# Patient Record
Sex: Male | Born: 1955
Health system: Southern US, Community
[De-identification: ages and names within clinical notes are randomized; demographics above are authoritative.]

## PROBLEM LIST (undated history)

## (undated) ENCOUNTER — Emergency Department (HOSPITAL_BASED_OUTPATIENT_CLINIC_OR_DEPARTMENT_OTHER): Admission: EM | Payer: Federal, State, Local not specified - PPO | Source: Home / Self Care

## (undated) DIAGNOSIS — K709 Alcoholic liver disease, unspecified: Secondary | ICD-10-CM

## (undated) DIAGNOSIS — I10 Essential (primary) hypertension: Secondary | ICD-10-CM

## (undated) HISTORY — DX: Alcoholic liver disease, unspecified: K70.9

---

## 1999-08-06 ENCOUNTER — Emergency Department (HOSPITAL_COMMUNITY): Admission: EM | Admit: 1999-08-06 | Discharge: 1999-08-06 | Payer: Self-pay | Admitting: Emergency Medicine

## 2001-03-22 ENCOUNTER — Emergency Department (HOSPITAL_COMMUNITY): Admission: EM | Admit: 2001-03-22 | Discharge: 2001-03-22 | Payer: Self-pay | Admitting: Emergency Medicine

## 2004-08-17 ENCOUNTER — Emergency Department (HOSPITAL_COMMUNITY): Admission: EM | Admit: 2004-08-17 | Discharge: 2004-08-17 | Payer: Self-pay | Admitting: Emergency Medicine

## 2008-08-21 ENCOUNTER — Emergency Department (HOSPITAL_COMMUNITY): Admission: EM | Admit: 2008-08-21 | Discharge: 2008-08-21 | Payer: Self-pay | Admitting: Emergency Medicine

## 2008-12-03 LAB — HEMOGLOBIN A1C: Hemoglobin A1C: 6.2

## 2011-06-13 LAB — POCT I-STAT, CHEM 8
BUN: 13 mg/dL (ref 6–23)
Calcium, Ion: 1.15 mmol/L (ref 1.12–1.32)
Creatinine, Ser: 1.2 mg/dL (ref 0.4–1.5)
Glucose, Bld: 92 mg/dL (ref 70–99)
TCO2: 25 mmol/L (ref 0–100)

## 2014-08-22 ENCOUNTER — Other Ambulatory Visit: Payer: Self-pay | Admitting: Family Medicine

## 2014-08-22 DIAGNOSIS — R1084 Generalized abdominal pain: Secondary | ICD-10-CM

## 2014-08-22 DIAGNOSIS — R52 Pain, unspecified: Secondary | ICD-10-CM

## 2014-08-23 ENCOUNTER — Other Ambulatory Visit: Payer: Self-pay

## 2015-03-04 ENCOUNTER — Emergency Department (HOSPITAL_COMMUNITY)
Admission: EM | Admit: 2015-03-04 | Discharge: 2015-03-04 | Disposition: A | Discharge status: Home / Self Care | Payer: Federal, State, Local not specified - PPO | Attending: Emergency Medicine | Admitting: Emergency Medicine

## 2015-03-04 ENCOUNTER — Encounter (HOSPITAL_COMMUNITY): Payer: Self-pay | Admitting: *Deleted

## 2015-03-04 DIAGNOSIS — R109 Unspecified abdominal pain: Secondary | ICD-10-CM | POA: Diagnosis not present

## 2015-03-04 DIAGNOSIS — Z79899 Other long term (current) drug therapy: Secondary | ICD-10-CM | POA: Diagnosis not present

## 2015-03-04 DIAGNOSIS — R1084 Generalized abdominal pain: Secondary | ICD-10-CM | POA: Diagnosis present

## 2015-03-04 DIAGNOSIS — I10 Essential (primary) hypertension: Secondary | ICD-10-CM | POA: Insufficient documentation

## 2015-03-04 HISTORY — DX: Essential (primary) hypertension: I10

## 2015-03-04 LAB — COMPREHENSIVE METABOLIC PANEL
ALBUMIN: 3.7 g/dL (ref 3.5–5.0)
ALK PHOS: 50 U/L (ref 38–126)
ALT: 20 U/L (ref 17–63)
ANION GAP: 7 (ref 5–15)
AST: 17 U/L (ref 15–41)
BILIRUBIN TOTAL: 0.4 mg/dL (ref 0.3–1.2)
BUN: 17 mg/dL (ref 6–20)
CO2: 30 mmol/L (ref 22–32)
CREATININE: 0.97 mg/dL (ref 0.61–1.24)
Calcium: 9.1 mg/dL (ref 8.9–10.3)
Chloride: 103 mmol/L (ref 101–111)
Glucose, Bld: 98 mg/dL (ref 65–99)
Potassium: 3.9 mmol/L (ref 3.5–5.1)
Sodium: 140 mmol/L (ref 135–145)
TOTAL PROTEIN: 6.7 g/dL (ref 6.5–8.1)

## 2015-03-04 LAB — URINALYSIS, ROUTINE W REFLEX MICROSCOPIC
Bilirubin Urine: NEGATIVE
Glucose, UA: NEGATIVE mg/dL
HGB URINE DIPSTICK: NEGATIVE
Ketones, ur: NEGATIVE mg/dL
Leukocytes, UA: NEGATIVE
NITRITE: NEGATIVE
PROTEIN: NEGATIVE mg/dL
Specific Gravity, Urine: 1.015 (ref 1.005–1.030)
Urobilinogen, UA: 0.2 mg/dL (ref 0.0–1.0)
pH: 6 (ref 5.0–8.0)

## 2015-03-04 LAB — CBC WITH DIFFERENTIAL/PLATELET
BASOS PCT: 0 % (ref 0–1)
Basophils Absolute: 0 10*3/uL (ref 0.0–0.1)
EOS ABS: 0.1 10*3/uL (ref 0.0–0.7)
EOS PCT: 2 % (ref 0–5)
HCT: 41.2 % (ref 39.0–52.0)
HEMOGLOBIN: 13.7 g/dL (ref 13.0–17.0)
LYMPHS ABS: 1.9 10*3/uL (ref 0.7–4.0)
LYMPHS PCT: 42 % (ref 12–46)
MCH: 26.9 pg (ref 26.0–34.0)
MCHC: 33.3 g/dL (ref 30.0–36.0)
MCV: 80.9 fL (ref 78.0–100.0)
MONO ABS: 0.5 10*3/uL (ref 0.1–1.0)
Monocytes Relative: 11 % (ref 3–12)
Neutro Abs: 2 10*3/uL (ref 1.7–7.7)
Neutrophils Relative %: 45 % (ref 43–77)
PLATELETS: 181 10*3/uL (ref 150–400)
RBC: 5.09 MIL/uL (ref 4.22–5.81)
RDW: 13.6 % (ref 11.5–15.5)
WBC: 4.5 10*3/uL (ref 4.0–10.5)

## 2015-03-04 LAB — LIPASE, BLOOD: LIPASE: 18 U/L — AB (ref 22–51)

## 2015-03-04 MED ORDER — MELOXICAM 7.5 MG PO TABS: 7.5000 mg | ORAL_TABLET | Freq: Every day | ORAL | Status: DC

## 2015-03-04 MED ORDER — SODIUM CHLORIDE 0.9 % IV SOLN
INTRAVENOUS | Status: DC
  Administered 2015-03-04: 13:00:00 via INTRAVENOUS

## 2015-03-04 MED ORDER — KETOROLAC TROMETHAMINE 30 MG/ML IJ SOLN
30.0000 mg | Freq: Once | INTRAMUSCULAR | Status: AC
  Administered 2015-03-04: 30 mg via INTRAVENOUS
  Filled 2015-03-04: qty 1

## 2015-03-04 NOTE — ED Notes (Signed)
Bed: WA18 Expected date:  Expected time:  Means of arrival:  Comments: Triage 2 

## 2015-03-04 NOTE — Discharge Instructions (Signed)
Abdominal Pain Many things can cause abdominal pain. Usually, abdominal pain is not caused by a disease and will improve without treatment. It can often be observed and treated at home. Your health care provider will do a physical exam and possibly order blood tests and X-rays to help determine the seriousness of your pain. However, in many cases, more time must pass before a clear cause of the pain can be found. Before that point, your health care provider may not know if you need more testing or further treatment. HOME CARE INSTRUCTIONS  Monitor your abdominal pain for any changes. The following actions may help to alleviate any discomfort you are experiencing:  Only take over-the-counter or prescription medicines as directed by your health care provider.  Do not take laxatives unless directed to do so by your health care provider.  Try a clear liquid diet (broth, tea, or water) as directed by your health care provider. Slowly move to a bland diet as tolerated. SEEK MEDICAL CARE IF:  You have unexplained abdominal pain.  You have abdominal pain associated with nausea or diarrhea.  You have pain when you urinate or have a bowel movement.  You experience abdominal pain that wakes you in the night.  You have abdominal pain that is worsened or improved by eating food.  You have abdominal pain that is worsened with eating fatty foods.  You have a fever. SEEK IMMEDIATE MEDICAL CARE IF:   Your pain does not go away within 2 hours.  You keep throwing up (vomiting).  Your pain is felt only in portions of the abdomen, such as the right side or the left lower portion of the abdomen.  You pass bloody or black tarry stools. MAKE SURE YOU:  Understand these instructions.   Will watch your condition.   Will get help right away if you are not doing well or get worse.  Document Released: 06/05/2005 Document Revised: 08/31/2013 Document Reviewed: 05/05/2013 [facility] Patient Information  2015 [place], [place]. This information is not intended to replace advice given to you by your health care provider. Make sure you discuss any questions you have with your health care provider.   High-Fiber Diet Fiber is found in fruits, vegetables, and grains. A high-fiber diet encourages the addition of more whole grains, legumes, fruits, and vegetables in your diet. The recommended amount of fiber for adult males is 38 g per day. For adult females, it is 25 g per day. Pregnant and lactating women should get 28 g of fiber per day. If you have a digestive or bowel problem, ask your caregiver for advice before adding high-fiber foods to your diet. Eat a variety of high-fiber foods instead of only a select few type of foods.  PURPOSE  To increase stool bulk.  To make bowel movements more regular to prevent constipation.  To lower cholesterol.  To prevent overeating. WHEN IS THIS DIET USED?  It may be used if you have constipation and hemorrhoids.  It may be used if you have uncomplicated diverticulosis (intestine condition) and irritable bowel syndrome.  It may be used if you need help with weight management.  It may be used if you want to add it to your diet as a protective measure against atherosclerosis, diabetes, and cancer. SOURCES OF FIBER  Whole-grain breads and cereals.  Fruits, such as apples, oranges, bananas, berries, prunes, and pears.  Vegetables, such as green peas, carrots, sweet potatoes, beets, broccoli, cabbage, spinach, and artichokes.  Legumes, such split peas, soy,  lentils.  Almonds. FIBER CONTENT IN FOODS Starches and Grains / Dietary Fiber (g)  Cheerios, 1 cup / 3 g  Corn Flakes cereal, 1 cup / 0.7 g  Rice crispy treat cereal, 1 cup / 0.3 g  Instant oatmeal (cooked),  cup / 2 g  Frosted wheat cereal, 1 cup / 5.1 g  Brown, long-grain rice (cooked), 1 cup / 3.5 g  White, long-grain rice (cooked), 1 cup / 0.6 g  Enriched macaroni (cooked), 1 cup /  2.5 g Legumes / Dietary Fiber (g)  Baked beans (canned, plain, or vegetarian),  cup / 5.2 g  Kidney beans (canned),  cup / 6.8 g  Pinto beans (cooked),  cup / 5.5 g Breads and Crackers / Dietary Fiber (g)  Plain or honey graham crackers, 2 squares / 0.7 g  Saltine crackers, 3 squares / 0.3 g  Plain, salted pretzels, 10 pieces / 1.8 g  Whole-wheat bread, 1 slice / 1.9 g  White bread, 1 slice / 0.7 g  Raisin bread, 1 slice / 1.2 g  Plain bagel, 3 oz / 2 g  Flour tortilla, 1 oz / 0.9 g  Corn tortilla, 1 small / 1.5 g  Hamburger or hotdog bun, 1 small / 0.9 g Fruits / Dietary Fiber (g)  Apple with skin, 1 medium / 4.4 g  Sweetened applesauce,  cup / 1.5 g  Banana,  medium / 1.5 g  Grapes, 10 grapes / 0.4 g  Orange, 1 small / 2.3 g  Raisin, 1.5 oz / 1.6 g  Melon, 1 cup / 1.4 g Vegetables / Dietary Fiber (g)  Green beans (canned),  cup / 1.3 g  Carrots (cooked),  cup / 2.3 g  Broccoli (cooked),  cup / 2.8 g  Peas (cooked),  cup / 4.4 g  Mashed potatoes,  cup / 1.6 g  Lettuce, 1 cup / 0.5 g  Corn (canned),  cup / 1.6 g  Tomato,  cup / 1.1 g Document Released: 08/26/2005 Document Revised: 02/25/2012 Document Reviewed: 11/28/2011 [employer] Patient Information 2015 [place], [place]. This information is not intended to replace advice given to you by your health care provider. Make sure you discuss any questions you have with your health care provider.

## 2015-03-04 NOTE — ED Notes (Addendum)
Abdominal pain and low back pain that started 6 days ago. Saw PCP for pain on Monday. Was scheduled for Colonoscopy on Thursday (unrelated to acute pain) missed d/t pain. Still having pain today, was seen at [facility] prior to ED arrival. Sent for CT of abdomen, r/o stone or diverticulitis. No constipation or diarrhea. No vomiting, fevers.  Alcohol and coffee bring on the pain

## 2015-03-04 NOTE — ED Provider Notes (Signed)
TIME SEEN: 12:30 PM  CHIEF COMPLAINT: Abdominal pain, lower back pain  HPI: Patient is a 59 year old male with history of hypertension who presents to the emergency department with 6 days of diffuse lower abdominal pain that radiates into his lower back. States pain was worse last night after drinking alcohol and coffee. Pain is improved but has not completely come on spontaneously. Is not aware of any alleviating factors. [facility] nurse there who told him to come to the emergency department because he would need a CT scan of his abdomen. He did not see a physician, APP at the Urgent Care.  He denies any fevers, chills, nausea, vomiting, diarrhea, dysuria, hematuria, hematochezia or melena, testicular pain or swelling, penile discharge. Has never had similar pain. Denies history of abdominal surgeries. States he is having normal bowel movements every day.    ROS: See HPI Constitutional: no fever  Eyes: no drainage  ENT: no runny nose   Cardiovascular:  no chest pain  Resp: no SOB  GI: no vomiting GU: no dysuria Integumentary: no rash  Allergy: no hives  Musculoskeletal: no leg swelling  Neurological: no slurred speech ROS otherwise negative  PAST MEDICAL HISTORY/PAST SURGICAL HISTORY:  Past Medical History  Diagnosis Date  . Hypertension     MEDICATIONS:  Prior to Admission medications   Medication Sig Start Date End Date Taking? Authorizing Provider  ibuprofen (ADVIL,MOTRIN) 200 MG tablet Take 400 mg by mouth every 4 (four) hours as needed for fever, headache, mild pain, moderate pain or cramping.   Yes Historical Provider, MD  valsartan-hydrochlorothiazide (DIOVAN-HCT) 320-12.5 MG per tablet Take 1 tablet by mouth daily. 01/05/15  Yes Historical Provider, MD    ALLERGIES:  No Known Allergies  SOCIAL HISTORY:  History  Substance Use Topics  . Smoking status: Never Smoker   . Smokeless tobacco: Not on file  . Alcohol Use: Yes   Comment: beer and liquor, socially    FAMILY HISTORY: No family history on file.  EXAM: BP 132/85 mmHg  Pulse 60  Temp(Src) 97.7 F (36.5 C) (Oral)  Resp 17  SpO2 97% CONSTITUTIONAL: Alert and oriented and responds appropriately to questions. Well-appearing; well-nourished, in no distress, nontoxic, afebrile, well hydrated HEAD: Normocephalic EYES: Conjunctivae clear, PERRL ENT: normal nose; no rhinorrhea; moist mucous membranes; pharynx without lesions noted NECK: Supple, no meningismus, no LAD  CARD: RRR; S1 and S2 appreciated; no murmurs, no clicks, no rubs, no gallops RESP: Normal chest excursion without splinting or tachypnea; breath sounds clear and equal bilaterally; no wheezes, no rhonchi, no rales, no hypoxia or respiratory distress, speaking full sentences ABD/GI: Normal bowel sounds; non-distended; soft, non-tender, no rebound, no guarding, no peritoneal signs, no tenderness at McBurney's point, negative Murphy sign, abdominal exam is completely benign  BACK:  The back appears normal and is non-tender to palpation, there is no CVA tenderness, no midline spinal tenderness or step-off or deformity, no tenderness over the paraspinal musculature EXT: Normal ROM in all joints; non-tender to palpation; no edema; normal capillary refill; no cyanosis, no calf tenderness or swelling    SKIN: Normal color for age and race; warm NEURO: Moves all extremities equally, sensation to light touch intact diffusely, cranial nerves II through XII intact PSYCH: The patient's mood and manner are appropriate. Grooming and personal hygiene are appropriate.  MEDICAL DECISION MAKING: Patient here with lower abdominal pain with no other associated symptoms. He is afebrile and hemodynamically stable.  His abdominal exam is completely benign. Discussed with patient that at this time do not feel a CT scan is indicated as I feel the risk of radiation exposure outweigh any benefits. Will order abdominal labs  and urine. Will give IV fluids and Toradol for pain. Patient is comfortable with this plan.  ED PROGRESS: Patient's labs are completely unremarkable. No leukocytosis. LFTs, creatinine and lipase normal. Urine shows no sign of infection and no hematuria. Pain improved with Toradol. We'll discharge with prescription for meloxicam to take as needed for pain. He has appointment with a gastroenterologist on Monday, 2 days from now to set up a routine colonoscopy. Also, since she follow-up with his primary care physician if pain continues. Discussed return precautions including worsening pain, fevers, vomiting, bloody stools or melena. He verbalizes understanding and is comfortable with this plan.     Layla Maw Cloy Cozzens, DO 03/04/15 [phone]

## 2015-03-06 ENCOUNTER — Other Ambulatory Visit: Payer: Self-pay | Admitting: Gastroenterology

## 2015-03-06 DIAGNOSIS — R1031 Right lower quadrant pain: Secondary | ICD-10-CM

## 2015-03-09 ENCOUNTER — Ambulatory Visit
Admission: RE | Admit: 2015-03-09 | Discharge: 2015-03-09 | Disposition: A | Discharge status: Home / Self Care | Payer: Federal, State, Local not specified - PPO | Source: Ambulatory Visit | Attending: Gastroenterology | Admitting: Gastroenterology

## 2015-03-09 DIAGNOSIS — R1031 Right lower quadrant pain: Secondary | ICD-10-CM

## 2015-03-09 MED ORDER — IOPAMIDOL (ISOVUE-300) INJECTION 61%
125.0000 mL | Freq: Once | INTRAVENOUS | Status: AC | PRN
  Administered 2015-03-09: 125 mL via INTRAVENOUS

## 2015-03-16 ENCOUNTER — Other Ambulatory Visit: Payer: Federal, State, Local not specified - PPO

## 2015-06-14 ENCOUNTER — Ambulatory Visit (INDEPENDENT_AMBULATORY_CARE_PROVIDER_SITE_OTHER): Payer: Federal, State, Local not specified - PPO

## 2015-06-14 ENCOUNTER — Ambulatory Visit: Payer: Federal, State, Local not specified - PPO

## 2015-06-14 ENCOUNTER — Ambulatory Visit (INDEPENDENT_AMBULATORY_CARE_PROVIDER_SITE_OTHER): Payer: Federal, State, Local not specified - PPO | Admitting: Podiatry

## 2015-06-14 DIAGNOSIS — M779 Enthesopathy, unspecified: Secondary | ICD-10-CM | POA: Diagnosis not present

## 2015-06-14 DIAGNOSIS — M21619 Bunion of unspecified foot: Secondary | ICD-10-CM | POA: Diagnosis not present

## 2015-06-14 DIAGNOSIS — S93601A Unspecified sprain of right foot, initial encounter: Secondary | ICD-10-CM

## 2015-06-14 MED ORDER — NAPROXEN 500 MG PO TABS: 500.0000 mg | ORAL_TABLET | Freq: Two times a day (BID) | ORAL | Status: DC

## 2015-06-14 MED ORDER — TRIAMCINOLONE ACETONIDE 10 MG/ML IJ SUSP
10.0000 mg | Freq: Once | INTRAMUSCULAR | Status: AC
  Administered 2015-06-14: 10 mg

## 2015-06-14 NOTE — Progress Notes (Signed)
   Subjective:    Patient ID: Donald Powers, male    DOB: 11/21/1955, 59 y.o.   MRN: [id number]  HPI Pt presents with right foot pain in ankle and heel lasting 3 weeks. Pain is located on the medial side of the right foot, he has tried nsaids and different shoes but pain is still present   Review of Systems  All other systems reviewed and are negative.      Objective:   Physical Exam        Assessment & Plan:

## 2015-06-14 NOTE — Progress Notes (Signed)
Subjective:     Patient ID: BERTIS HUSTEAD, male   DOB: [birth year], 59 y.o.   MRN: [id number]  HPI patient states I've had a lot of pain around my right ankle for the last 3 weeks and it's been quite inflamed and sore. Patient states that he's tried to reduce activity and wear supportive shoe without relief   Review of Systems  All other systems reviewed and are negative.      Objective:   Physical Exam  Constitutional: He is oriented to person, place, and time.  Cardiovascular: Intact distal pulses.   Musculoskeletal: Normal range of motion.  Neurological: He is oriented to person, place, and time.  Skin: Skin is warm.  Nursing note and vitals reviewed.  neurovascular status found to be intact with muscle strength adequate range of motion within normal limits. Patient's noted to have quite a bit of inflammation around the medial ankle posterior tibial tendon as it comes under the medial malleolus with depression of the arch noted. The tendon appears to be functioning but is quite inflamed and there is significant structural malalignment with large bunion deformity and digital deformities right     Assessment:     Collapse medial longitudinal arch with tendinitis and pain along with structural bunion digital deformity    Plan:     H&P x-rays reviewed and discussed condition. Today I carefully injected sheath of the tendon 3 mg Kenalog 5 mg Xylocaine advised on ice therapy supportive shoe gear usage and possibility long-term for orthotics. Dispensed fascial brace with instructions on usage and reappoint in the next 2 weeks

## 2015-06-22 ENCOUNTER — Encounter: Payer: Self-pay | Admitting: Podiatry

## 2015-06-22 ENCOUNTER — Ambulatory Visit (INDEPENDENT_AMBULATORY_CARE_PROVIDER_SITE_OTHER): Payer: Federal, State, Local not specified - PPO | Admitting: Podiatry

## 2015-06-22 DIAGNOSIS — M779 Enthesopathy, unspecified: Secondary | ICD-10-CM | POA: Diagnosis not present

## 2015-06-22 DIAGNOSIS — M722 Plantar fascial fibromatosis: Secondary | ICD-10-CM | POA: Diagnosis not present

## 2015-06-22 DIAGNOSIS — S93601A Unspecified sprain of right foot, initial encounter: Secondary | ICD-10-CM | POA: Diagnosis not present

## 2015-06-23 NOTE — Progress Notes (Signed)
Subjective:     Patient ID: Donald Powers, male   DOB: 08-03-1956, 59 y.o.   MRN: [id number]  HPI patient presents stating I'm still getting pain but I am somewhat improved from previous but unable to walk without discomfort   Review of Systems     Objective:   Physical Exam Neurovascular status intact muscle strength adequate with diminishment of discomfort in the right foot but pain still present upon deep palpation with moderate depression of the arch noted    Assessment:     Inflammatory tendinitis right foot still present but improved    Plan:     Advised on physical therapy and anti-inflammatories supportive shoe gear and scanned for Berkley type orthotic to reduce plantar stresses

## 2015-07-14 ENCOUNTER — Ambulatory Visit: Payer: Federal, State, Local not specified - PPO | Admitting: *Deleted

## 2015-07-14 DIAGNOSIS — M779 Enthesopathy, unspecified: Secondary | ICD-10-CM

## 2015-07-14 NOTE — Progress Notes (Signed)
Patient ID: Donald Powers, male   DOB: 1956/08/01, 59 y.o.   MRN: [id number] Patient presents for orthotic pick up.  Verbal and written break in and wear instructions given.  Patient will follow up in 4 weeks if symptoms worsen or fail to improve.

## 2015-07-14 NOTE — Patient Instructions (Signed)

## 2015-08-07 ENCOUNTER — Ambulatory Visit (INDEPENDENT_AMBULATORY_CARE_PROVIDER_SITE_OTHER): Payer: Federal, State, Local not specified - PPO | Admitting: Podiatry

## 2015-08-07 ENCOUNTER — Encounter: Payer: Self-pay | Admitting: Podiatry

## 2015-08-07 VITALS — BP 128/80 | HR 62 | Resp 16

## 2015-08-07 DIAGNOSIS — M779 Enthesopathy, unspecified: Secondary | ICD-10-CM | POA: Diagnosis not present

## 2015-08-07 MED ORDER — TRIAMCINOLONE ACETONIDE 10 MG/ML IJ SUSP
10.0000 mg | Freq: Once | INTRAMUSCULAR | Status: AC
  Administered 2015-08-07: 10 mg

## 2015-08-08 NOTE — Progress Notes (Signed)
Subjective:     Patient ID: Donald Powers, male   DOB: May 04, 1956, 59 y.o.   MRN: [id number]  HPI patient states she's been getting some irritation along the inside of the right ankle and was not sure if the orthotic might be giving him problems   Review of Systems     Objective:   Physical Exam Neurovascular status intact with some sheath discomfort at the right posterior tibial as it comes under the medial malleolus with no indication of muscle strength loss    Assessment:     Inflammatory tendinitis right    Plan:     I advised this patient on physical therapy supportive orthotic usage and I reinjected the sheath 3 mg Kenalog 5 mg Xylocaine after discussing risk. May require other treatments if symptoms continue to persist

## 2015-08-16 ENCOUNTER — Ambulatory Visit: Payer: Federal, State, Local not specified - PPO | Admitting: Podiatry

## 2015-11-15 ENCOUNTER — Other Ambulatory Visit: Payer: Federal, State, Local not specified - PPO

## 2015-11-16 ENCOUNTER — Ambulatory Visit: Payer: Federal, State, Local not specified - PPO | Admitting: *Deleted

## 2015-11-16 DIAGNOSIS — M779 Enthesopathy, unspecified: Secondary | ICD-10-CM

## 2015-11-16 NOTE — Patient Instructions (Signed)

## 2015-11-16 NOTE — Progress Notes (Signed)
Patient ID: Donald Powers, male   DOB: 1956/02/09, 60 y.o.   MRN: [id number] Patient presents for adjusted orthotic pick up.  Verbal and written break in and wear instructions given.  Patient will follow up in 4 weeks if symptoms worsen or fail to improve.

## 2015-12-07 ENCOUNTER — Ambulatory Visit: Payer: Federal, State, Local not specified - PPO | Admitting: Podiatry

## 2015-12-13 ENCOUNTER — Ambulatory Visit: Payer: Federal, State, Local not specified - PPO | Admitting: Podiatry

## 2015-12-14 ENCOUNTER — Encounter: Payer: Self-pay | Admitting: Podiatry

## 2015-12-14 ENCOUNTER — Ambulatory Visit (INDEPENDENT_AMBULATORY_CARE_PROVIDER_SITE_OTHER): Payer: Federal, State, Local not specified - PPO | Admitting: Podiatry

## 2015-12-14 VITALS — BP 145/94 | HR 67 | Resp 14

## 2015-12-14 DIAGNOSIS — M779 Enthesopathy, unspecified: Secondary | ICD-10-CM | POA: Diagnosis not present

## 2015-12-14 DIAGNOSIS — M21619 Bunion of unspecified foot: Secondary | ICD-10-CM | POA: Diagnosis not present

## 2015-12-14 DIAGNOSIS — S93601A Unspecified sprain of right foot, initial encounter: Secondary | ICD-10-CM

## 2015-12-14 NOTE — Progress Notes (Signed)
Subjective:     Patient ID: Donald Powers, male   DOB: 1955-11-27, 60 y.o.   MRN: [id number]  HPI this patient presents the office with the return of pain on the inside of his right foot. Patient states that he was diagnosed as having a tendinitis, right foot by Dr. Charlsie Merlesregal. He was treated with injection therapy as well as a plantar fascial brace. He states he has done well for months, but that the pain has returned. He states that he walks on concrete floors which aggravates his condition. He presents the office today for further evaluation and treatment of this condition   Review of Systems     Objective:   Physical Exam GENERAL APPEARANCE: Alert, conversant. Appropriately groomed. No acute distress.  VASCULAR: Pedal pulses are  palpable at  [facility] bilateral.  Capillary refill time is immediate to all digits,  Normal temperature gradient.  Digital hair growth is present bilateral  NEUROLOGIC: sensation is normal to 5.07 monofilament at 5/5 sites bilateral.  Light touch is intact bilateral, Muscle strength normal.  MUSCULOSKELETAL: acceptable muscle strength, tone and stability bilateral.  Intrinsic muscluature intact bilateral.  Rectus appearance of foot and digits noted bilateral. Severe HAV deformity 1st MPJ right foot. Bony prominence on the TNJ right foot. Patient stands with flexible pes planus.  Muscle power WNL.    DERMATOLOGIC: skin color, texture, and turgor are within normal limits.  No preulcerative lesions or ulcers  are seen, no interdigital maceration noted.  No open lesions present.  Digital nails are asymptomatic. No drainage noted.      Assessment:  PTT tendinitis  Rearfoot arthritis right foot.     Plan:     ROV.  Evaluated right foot and he has arthritic changes and insertional tendinitis pain right foot.  Treated with injection therapy.  Dispense plantar fascial brace.  To consider [place] brace.  RTC prn   Helane GuntherGregory Brecken Dewoody DPM

## 2016-09-04 DIAGNOSIS — R52 Pain, unspecified: Secondary | ICD-10-CM | POA: Diagnosis not present

## 2016-09-04 DIAGNOSIS — J111 Influenza due to unidentified influenza virus with other respiratory manifestations: Secondary | ICD-10-CM | POA: Diagnosis not present

## 2016-11-26 DIAGNOSIS — I10 Essential (primary) hypertension: Secondary | ICD-10-CM | POA: Diagnosis not present

## 2017-03-22 IMAGING — CT CT ABD-PELV W/ CM
3 of 5 series · 13 of 36 positions shown, 19 images · IV contrast (READICAT/WATER & [ID] ISOVUE 300)
Comparison: None.

CLINICAL DATA: Right lower quadrant and flank pain, episodic left
lower quadrant abdominal pain.

EXAM:
CT ABDOMEN AND PELVIS WITH CONTRAST
TECHNIQUE: Multidetector CT imaging of the abdomen and pelvis was performed
using the standard protocol following bolus administration of
intravenous contrast.
CONTRAST:  125mL BQ0RG1-2FF IOPAMIDOL (BQ0RG1-2FF) INJECTION 61%

[Series 3: abd/pelvis with · axial · 0.80mm/px · z∈[-366,-21]mm · 8 of 89 slices shown, 13 images]
[im 10/89  soft-tissue]
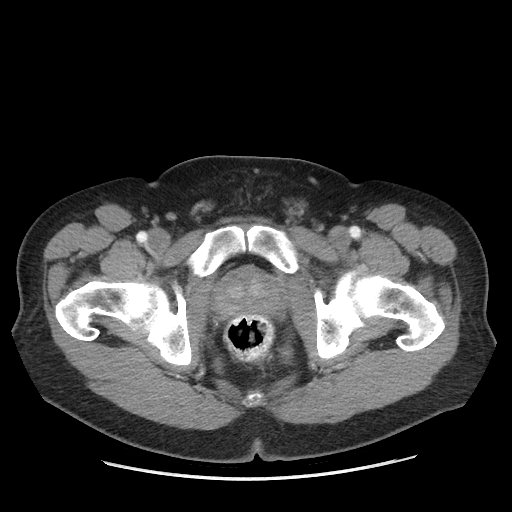
[im 10/89  bone]
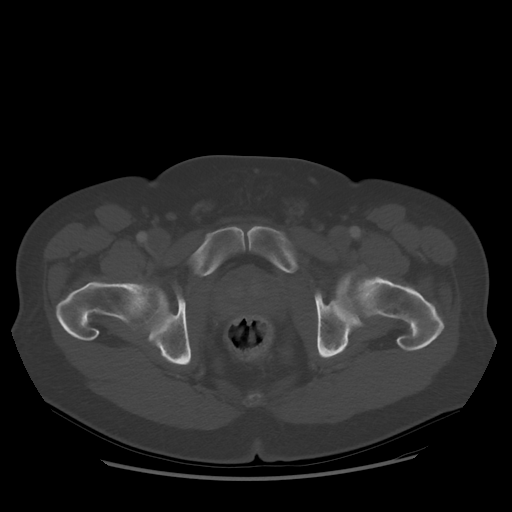
[im 20/89  soft-tissue]
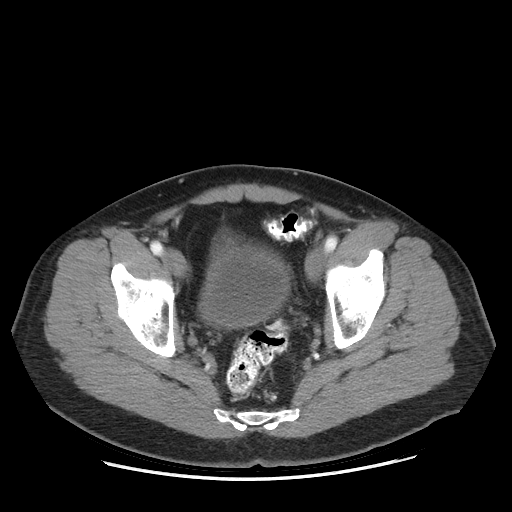
[im 30/89  soft-tissue]
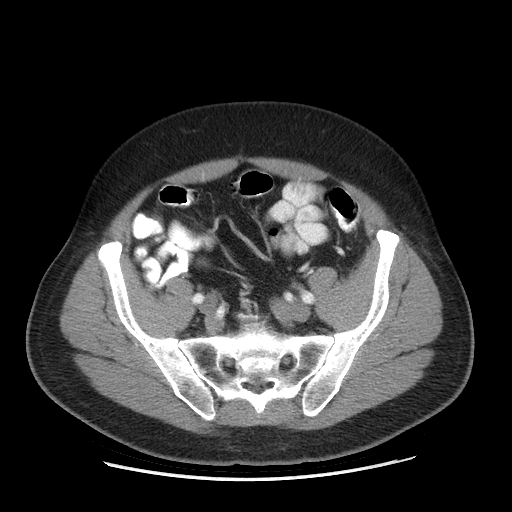
[im 40/89  soft-tissue]
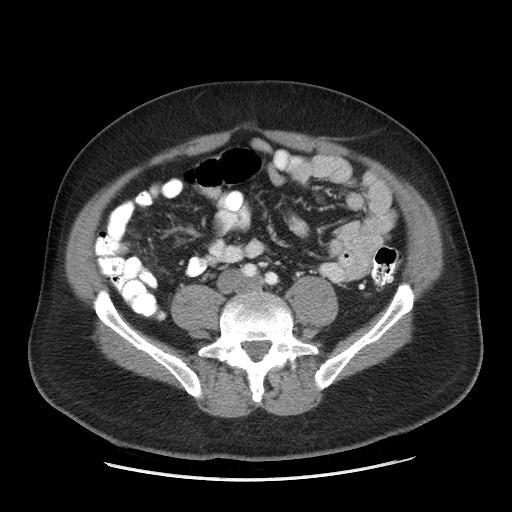
[im 49/89  soft-tissue]
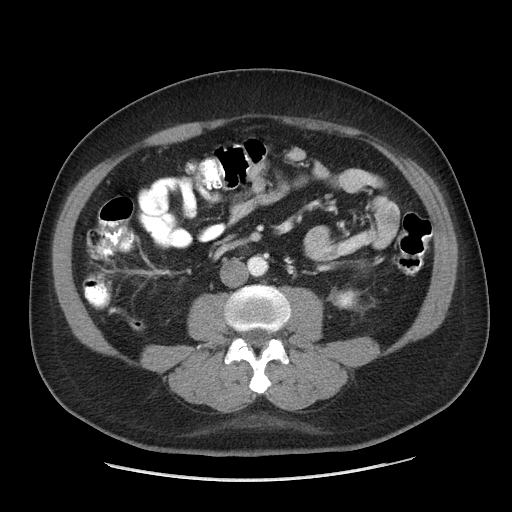
[im 49/89  lung]
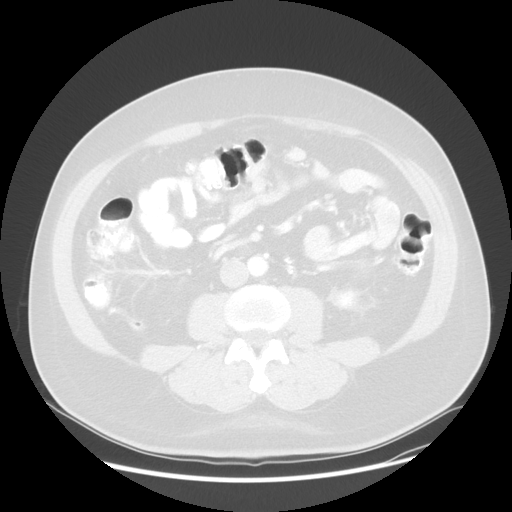
[im 59/89  soft-tissue]
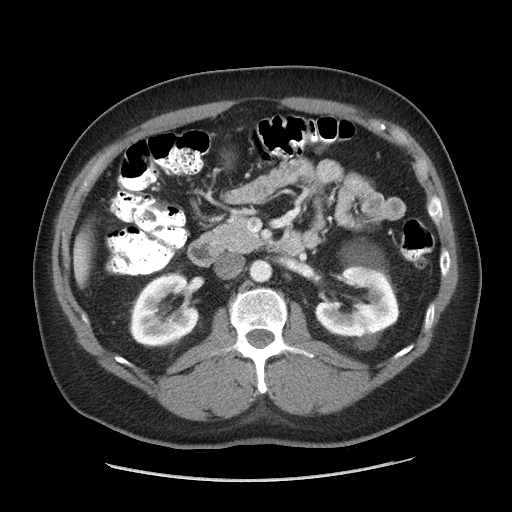
[im 59/89  lung]
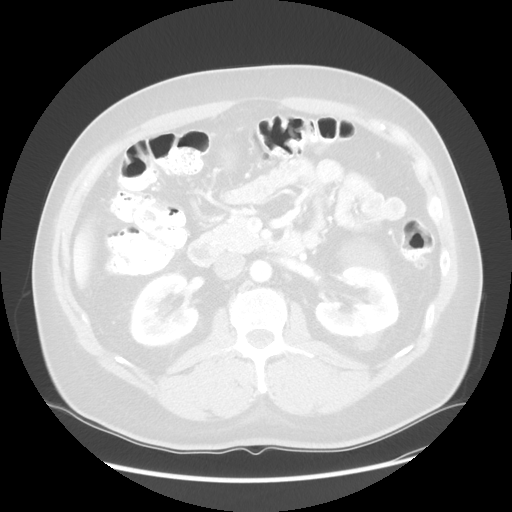
[im 69/89  soft-tissue]
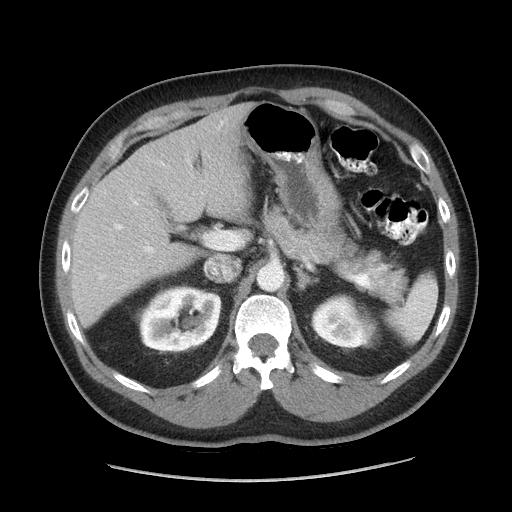
[im 69/89  lung]
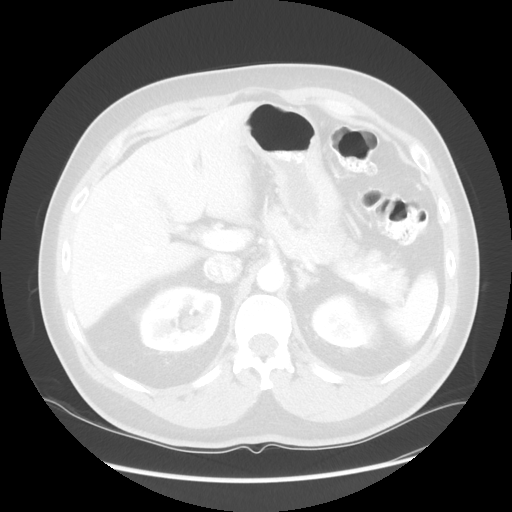
[im 79/89  soft-tissue]
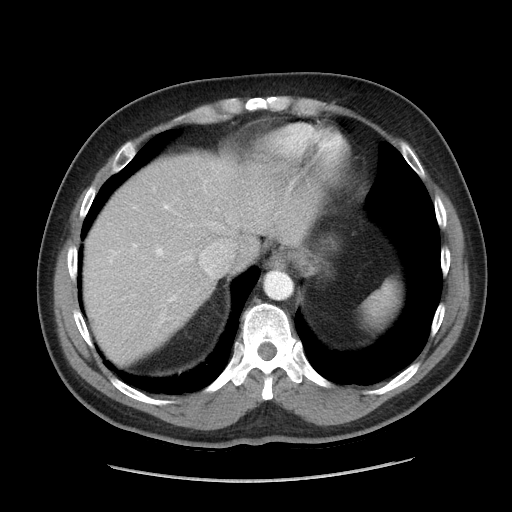
[im 79/89  lung]
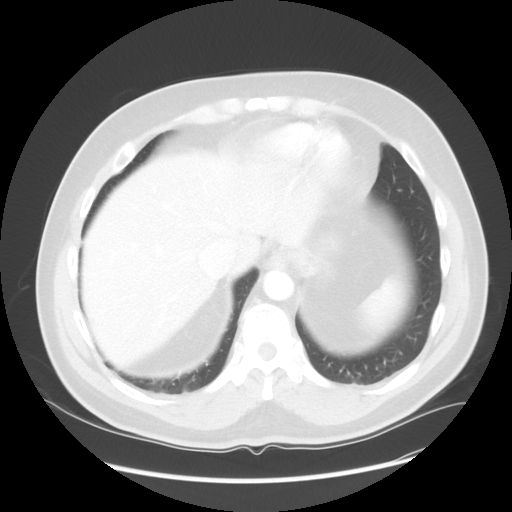

[Series 601: coronal body · coronal · 0.90mm/px · 1 of 134 slices shown, 2 images]
[im 45/134  soft-tissue]
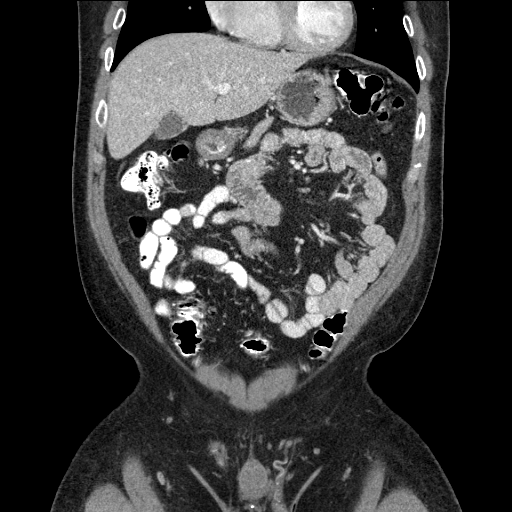
[im 45/134  bone]
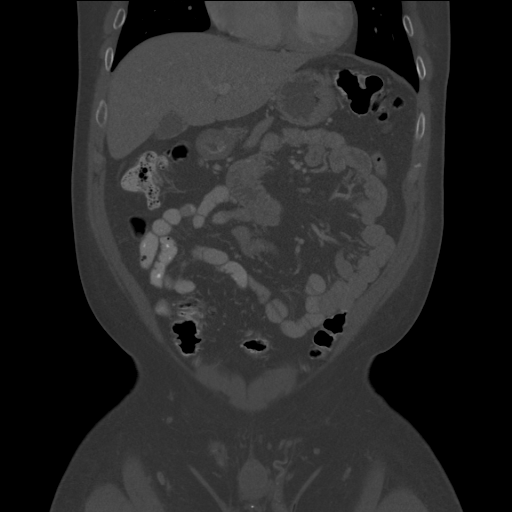

[Series 602: sagittal body · sagittal · 0.90mm/px · 4 of 160 slices shown]
[im 10/160  soft-tissue]
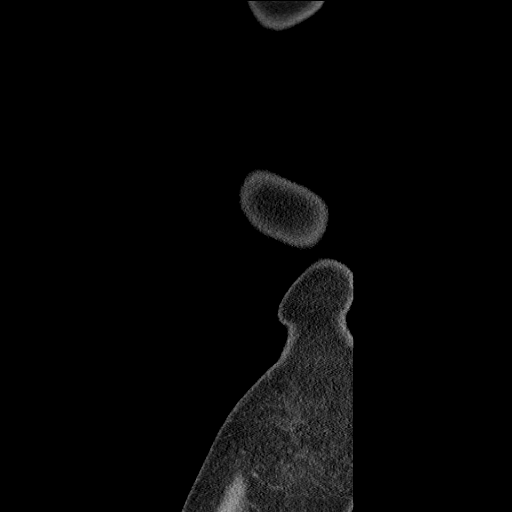
[im 30/160  soft-tissue]
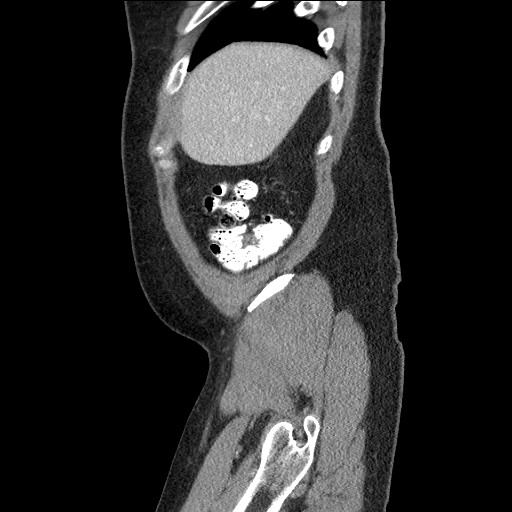
[im 50/160  soft-tissue]
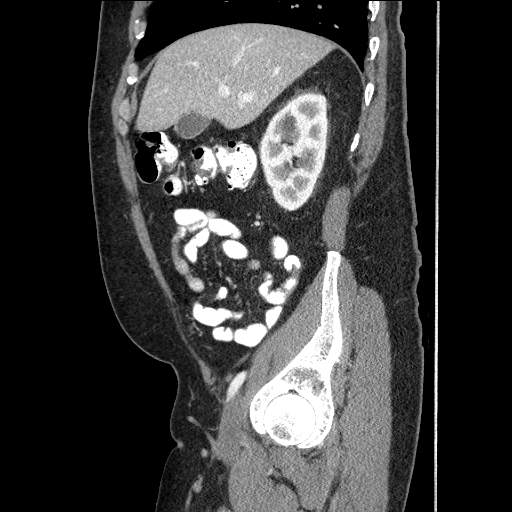
[im 70/160  soft-tissue]
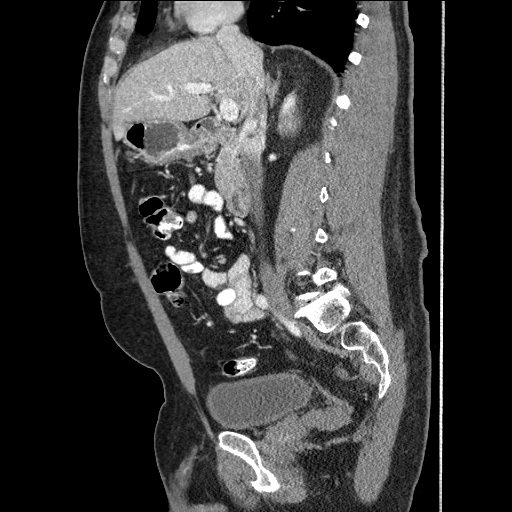

[13 of 36 positions shown; findings below may reference images not displayed]

FINDINGS: Lower chest: Minimal bibasilar dependent atelectasis or scarring
noted.

Hepatobiliary: The liver and gallbladder are unremarkable.

Pancreas: Normal

Spleen: Normal

Adrenals/Urinary Tract: Adrenal glands are normal. Bilateral too
small to characterize and fluid density renal cortical cysts are
identified, largest left mid kidney measuring 5.3 cm image 35. No
hydroureteronephrosis. No radiopaque renal, ureteral, or bladder
calculus.

Stomach/Bowel: Stomach appears normal. No bowel wall thickening or
focal segmental dilatation is identified. Normal appendix.

Vascular/Lymphatic: Minimal atheromatous aortic calcification
without aneurysm. No lymphadenopathy.

Other: The bladder is normal. Mild lobulation to the right seminal
vesicle image 76 is compatible with volume averaging. No surrounding
stranding or surrounding fluid collection.

Musculoskeletal: Right seventh rib bone island incidentally noted.
No acute osseous abnormality. Mild L5-S1 disc degenerative change.
IMPRESSION: No acute intra-abdominal or pelvic pathology.

## 2017-06-17 DIAGNOSIS — Z0000 Encounter for general adult medical examination without abnormal findings: Secondary | ICD-10-CM | POA: Diagnosis not present

## 2017-06-17 DIAGNOSIS — Z125 Encounter for screening for malignant neoplasm of prostate: Secondary | ICD-10-CM | POA: Diagnosis not present

## 2017-06-17 DIAGNOSIS — Z23 Encounter for immunization: Secondary | ICD-10-CM | POA: Diagnosis not present

## 2017-06-17 DIAGNOSIS — E782 Mixed hyperlipidemia: Secondary | ICD-10-CM | POA: Diagnosis not present

## 2017-12-16 DIAGNOSIS — I10 Essential (primary) hypertension: Secondary | ICD-10-CM | POA: Diagnosis not present

## 2017-12-16 DIAGNOSIS — R202 Paresthesia of skin: Secondary | ICD-10-CM | POA: Diagnosis not present

## 2017-12-16 DIAGNOSIS — Z6829 Body mass index (BMI) 29.0-29.9, adult: Secondary | ICD-10-CM | POA: Diagnosis not present

## 2018-01-13 DIAGNOSIS — B029 Zoster without complications: Secondary | ICD-10-CM | POA: Diagnosis not present

## 2018-01-31 DIAGNOSIS — R1012 Left upper quadrant pain: Secondary | ICD-10-CM | POA: Diagnosis not present

## 2018-02-06 DIAGNOSIS — R109 Unspecified abdominal pain: Secondary | ICD-10-CM | POA: Diagnosis not present

## 2018-06-18 DIAGNOSIS — Z23 Encounter for immunization: Secondary | ICD-10-CM | POA: Diagnosis not present

## 2018-06-18 DIAGNOSIS — I10 Essential (primary) hypertension: Secondary | ICD-10-CM | POA: Diagnosis not present

## 2018-08-26 DIAGNOSIS — K080 Exfoliation of teeth due to systemic causes: Secondary | ICD-10-CM | POA: Diagnosis not present

## 2018-09-21 DIAGNOSIS — M545 Low back pain: Secondary | ICD-10-CM | POA: Diagnosis not present

## 2018-11-03 ENCOUNTER — Other Ambulatory Visit: Payer: Self-pay | Admitting: Family Medicine

## 2018-11-03 ENCOUNTER — Ambulatory Visit
Admission: RE | Admit: 2018-11-03 | Discharge: 2018-11-03 | Disposition: A | Discharge status: Home / Self Care | Payer: Federal, State, Local not specified - PPO | Source: Ambulatory Visit | Attending: Family Medicine | Admitting: Family Medicine

## 2018-11-03 DIAGNOSIS — M545 Low back pain, unspecified: Secondary | ICD-10-CM

## 2018-11-03 DIAGNOSIS — M4807 Spinal stenosis, lumbosacral region: Secondary | ICD-10-CM | POA: Diagnosis not present

## 2018-11-03 DIAGNOSIS — M5137 Other intervertebral disc degeneration, lumbosacral region: Secondary | ICD-10-CM | POA: Diagnosis not present

## 2018-11-04 DIAGNOSIS — K080 Exfoliation of teeth due to systemic causes: Secondary | ICD-10-CM | POA: Diagnosis not present

## 2018-12-28 DIAGNOSIS — K602 Anal fissure, unspecified: Secondary | ICD-10-CM | POA: Diagnosis not present

## 2019-03-24 DIAGNOSIS — K6289 Other specified diseases of anus and rectum: Secondary | ICD-10-CM | POA: Diagnosis not present

## 2019-03-24 DIAGNOSIS — K5900 Constipation, unspecified: Secondary | ICD-10-CM | POA: Diagnosis not present

## 2019-05-10 DIAGNOSIS — K6289 Other specified diseases of anus and rectum: Secondary | ICD-10-CM | POA: Diagnosis not present

## 2019-05-10 DIAGNOSIS — K5900 Constipation, unspecified: Secondary | ICD-10-CM | POA: Diagnosis not present

## 2019-05-10 DIAGNOSIS — Z1211 Encounter for screening for malignant neoplasm of colon: Secondary | ICD-10-CM | POA: Diagnosis not present

## 2019-05-24 DIAGNOSIS — I10 Essential (primary) hypertension: Secondary | ICD-10-CM | POA: Diagnosis not present

## 2019-05-24 DIAGNOSIS — N529 Male erectile dysfunction, unspecified: Secondary | ICD-10-CM | POA: Diagnosis not present

## 2019-05-24 DIAGNOSIS — Z23 Encounter for immunization: Secondary | ICD-10-CM | POA: Diagnosis not present

## 2019-05-24 DIAGNOSIS — K5900 Constipation, unspecified: Secondary | ICD-10-CM | POA: Diagnosis not present

## 2019-05-24 DIAGNOSIS — E782 Mixed hyperlipidemia: Secondary | ICD-10-CM | POA: Diagnosis not present

## 2019-08-18 ENCOUNTER — Ambulatory Visit: Payer: Federal, State, Local not specified - PPO | Admitting: Podiatry

## 2019-08-18 ENCOUNTER — Other Ambulatory Visit: Payer: Self-pay

## 2019-08-18 ENCOUNTER — Other Ambulatory Visit: Payer: Self-pay | Admitting: Podiatry

## 2019-08-18 ENCOUNTER — Encounter: Payer: Self-pay | Admitting: Podiatry

## 2019-08-18 ENCOUNTER — Ambulatory Visit (INDEPENDENT_AMBULATORY_CARE_PROVIDER_SITE_OTHER): Payer: Federal, State, Local not specified - PPO

## 2019-08-18 DIAGNOSIS — M79671 Pain in right foot: Secondary | ICD-10-CM | POA: Diagnosis not present

## 2019-08-18 DIAGNOSIS — M779 Enthesopathy, unspecified: Secondary | ICD-10-CM

## 2019-08-18 DIAGNOSIS — L600 Ingrowing nail: Secondary | ICD-10-CM

## 2019-08-18 DIAGNOSIS — M79672 Pain in left foot: Secondary | ICD-10-CM

## 2019-08-18 MED ORDER — NEOMYCIN-POLYMYXIN-HC 3.5-10000-1 OT SOLN: OTIC | 0 refills | Status: DC

## 2019-08-18 MED ORDER — NEOMYCIN-POLYMYXIN-HC 3.5-10000-1 OT SOLN: OTIC | 1 refills | Status: DC

## 2019-08-18 NOTE — Progress Notes (Signed)
Subjective:   Patient ID: Donald Powers, male   DOB: 63 y.o.   MRN: [id number]   HPI Patient states my big toenail left is really started to bother me like the right one that was removed a number of years ago.  Also my arch is really dropped on the right and I know I need new inserts to support   Review of Systems  All other systems reviewed and are negative.       Objective:  Physical Exam Vitals signs and nursing note reviewed.  Constitutional:      Appearance: He is well-developed.  Pulmonary:     Effort: Pulmonary effort is normal.  Musculoskeletal: Normal range of motion.  Skin:    General: Skin is warm.  Neurological:     Mental Status: He is alert.     Neurovascular status intact muscle strength adequate range of motion within normal limits with good digital perfusion noted.  Thickened hallux nail left that is painful when pressed with damage to the nailbed which is occurred and has significant flatfoot deformity right with large bunion deformity noted right over left.  Patient is noted to be well oriented x3 and does work at a weightbearing job cement floors and does not smoke likes to be active     Assessment:  Damage thickened hallux nail left that is painful when pressed along with flatfoot deformity right secondary to foot structure with bad bunion deformity noted     Plan:  H&P all conditions reviewed and at this point I went ahead and recommended long-term orthotics and reviewed orthotics with patient and patient will be seen by ped orthotist due to the unique nature of foot structure and will bring his old..  Recommended removal of the nail and patient wants this done and I explained procedure risk and patient wants surgery and today I allowed him to sign consent form for permanent removal.  I infiltrated the left hallux 60 mg like Marcaine mixture sterile prep applied and using sterile instrumentation remove the hallux nail exposed matrix and applied phenol 5  applications 30 seconds followed by alcohol lavage and sterile dressing.  Gave instructions on soaks reappoint and encouraged him to call with questions concerns and wrote prescription for drops  X-rays indicate severe flatfoot deformity right with structural bunion deformity right over left

## 2019-08-18 NOTE — Patient Instructions (Signed)

## 2019-09-01 ENCOUNTER — Other Ambulatory Visit: Payer: Self-pay

## 2019-09-01 ENCOUNTER — Encounter: Payer: Self-pay | Admitting: Podiatry

## 2019-09-01 ENCOUNTER — Ambulatory Visit: Payer: Federal, State, Local not specified - PPO | Admitting: Podiatry

## 2019-09-01 DIAGNOSIS — M779 Enthesopathy, unspecified: Secondary | ICD-10-CM

## 2019-09-01 DIAGNOSIS — L03032 Cellulitis of left toe: Secondary | ICD-10-CM

## 2019-09-01 NOTE — Progress Notes (Signed)
Subjective:   Patient ID: Donald Powers, male   DOB: 63 y.o.   MRN: [id number]   HPI Presents stating he was concerned about the nail on the left and also stating that the injection he received has just started to wear off   ROS      Objective:  Physical Exam  Neurovascular status intact with patient's left nailbed healing well with some crusted tissue on the proximal portion localized with no proximal edema erythema drainage with mild discomfort of the tendon F2     Assessment:  Low-grade paronychia infection left that I am not concerned about with low-grade inflammation     Plan:  Reviewed both conditions recommended continuation of soaks once a day bandage usage during the day and letting it air dry at night.  Continue supportive shoe ice as needed

## 2019-09-06 ENCOUNTER — Other Ambulatory Visit: Payer: Self-pay | Admitting: Orthotics

## 2019-09-06 ENCOUNTER — Ambulatory Visit: Payer: Federal, State, Local not specified - PPO | Admitting: Podiatry

## 2019-10-08 DIAGNOSIS — Z20828 Contact with and (suspected) exposure to other viral communicable diseases: Secondary | ICD-10-CM | POA: Diagnosis not present

## 2019-11-25 DIAGNOSIS — I10 Essential (primary) hypertension: Secondary | ICD-10-CM | POA: Diagnosis not present

## 2019-11-25 DIAGNOSIS — K6289 Other specified diseases of anus and rectum: Secondary | ICD-10-CM | POA: Diagnosis not present

## 2019-11-25 DIAGNOSIS — E782 Mixed hyperlipidemia: Secondary | ICD-10-CM | POA: Diagnosis not present

## 2019-11-25 DIAGNOSIS — Z125 Encounter for screening for malignant neoplasm of prostate: Secondary | ICD-10-CM | POA: Diagnosis not present

## 2019-11-25 DIAGNOSIS — N529 Male erectile dysfunction, unspecified: Secondary | ICD-10-CM | POA: Diagnosis not present

## 2019-11-25 DIAGNOSIS — Z0000 Encounter for general adult medical examination without abnormal findings: Secondary | ICD-10-CM | POA: Diagnosis not present

## 2020-02-01 DIAGNOSIS — S0001XA Abrasion of scalp, initial encounter: Secondary | ICD-10-CM | POA: Diagnosis not present

## 2020-02-02 ENCOUNTER — Ambulatory Visit: Payer: Federal, State, Local not specified - PPO | Attending: Internal Medicine

## 2020-02-02 DIAGNOSIS — Z23 Encounter for immunization: Secondary | ICD-10-CM

## 2020-02-02 NOTE — Progress Notes (Signed)
   Covid-19 Vaccination Clinic  Name:  Anuel Sitter    MRN: [id number] DOB: 1956-03-06  02/02/2020  Mr. Colberg was observed post Covid-19 immunization for 15 minutes without incident. He was provided with Vaccine Information Sheet and instruction to access the V-Safe system.   Mr. Grandstaff was instructed to call 911 with any severe reactions post vaccine: Marland Kitchen Difficulty breathing  . Swelling of face and throat  . A fast heartbeat  . A bad rash all over body  . Dizziness and weakness   Immunizations Administered    Name Date Dose VIS Date Route   Pfizer COVID-19 Vaccine 02/02/2020 12:05 PM 0.3 mL 11/03/2018 Intramuscular   Manufacturer: [employer], [employer]   Lot: [id number]   NDC: [address]-9

## 2020-02-28 ENCOUNTER — Ambulatory Visit: Payer: Federal, State, Local not specified - PPO | Attending: Internal Medicine

## 2020-02-28 DIAGNOSIS — Z23 Encounter for immunization: Secondary | ICD-10-CM

## 2020-02-28 NOTE — Progress Notes (Signed)
   Covid-19 Vaccination Clinic  Name:  Shah Insley    MRN: [id number] DOB: [birth year]  02/28/2020  Mr. Gathright was observed post Covid-19 immunization for 15 minutes without incident. He was provided with Vaccine Information Sheet and instruction to access the V-Safe system.   Mr. Files was instructed to call 911 with any severe reactions post vaccine: Marland Kitchen Difficulty breathing  . Swelling of face and throat  . A fast heartbeat  . A bad rash all over body  . Dizziness and weakness   Immunizations Administered    Name Date Dose VIS Date Route   Pfizer COVID-19 Vaccine 02/28/2020  2:02 PM 0.3 mL 11/03/2018 Intramuscular   Manufacturer: [employer], [employer]   Lot: [id number]   NDC: [address]-7

## 2020-05-10 DIAGNOSIS — Z1211 Encounter for screening for malignant neoplasm of colon: Secondary | ICD-10-CM | POA: Diagnosis not present

## 2020-06-13 DIAGNOSIS — I10 Essential (primary) hypertension: Secondary | ICD-10-CM | POA: Diagnosis not present

## 2020-06-13 DIAGNOSIS — Z23 Encounter for immunization: Secondary | ICD-10-CM | POA: Diagnosis not present

## 2020-06-13 DIAGNOSIS — N529 Male erectile dysfunction, unspecified: Secondary | ICD-10-CM | POA: Diagnosis not present

## 2020-06-13 DIAGNOSIS — E782 Mixed hyperlipidemia: Secondary | ICD-10-CM | POA: Diagnosis not present

## 2020-06-13 DIAGNOSIS — D649 Anemia, unspecified: Secondary | ICD-10-CM | POA: Diagnosis not present

## 2020-06-29 DIAGNOSIS — L810 Postinflammatory hyperpigmentation: Secondary | ICD-10-CM | POA: Diagnosis not present

## 2020-07-25 DIAGNOSIS — R202 Paresthesia of skin: Secondary | ICD-10-CM | POA: Diagnosis not present

## 2020-08-24 ENCOUNTER — Ambulatory Visit: Payer: Federal, State, Local not specified - PPO

## 2020-11-08 DIAGNOSIS — Z20822 Contact with and (suspected) exposure to covid-19: Secondary | ICD-10-CM | POA: Diagnosis not present

## 2020-11-16 IMAGING — CR DG LUMBAR SPINE 2-3V
3 series · 3 of 3 positions shown · non-contrast
Comparison: Body CT 03/09/2015

CLINICAL DATA: Chronic back pain for 2 months.

EXAM:
LUMBAR SPINE - 2-3 VIEW

[w lumbar spine ap]
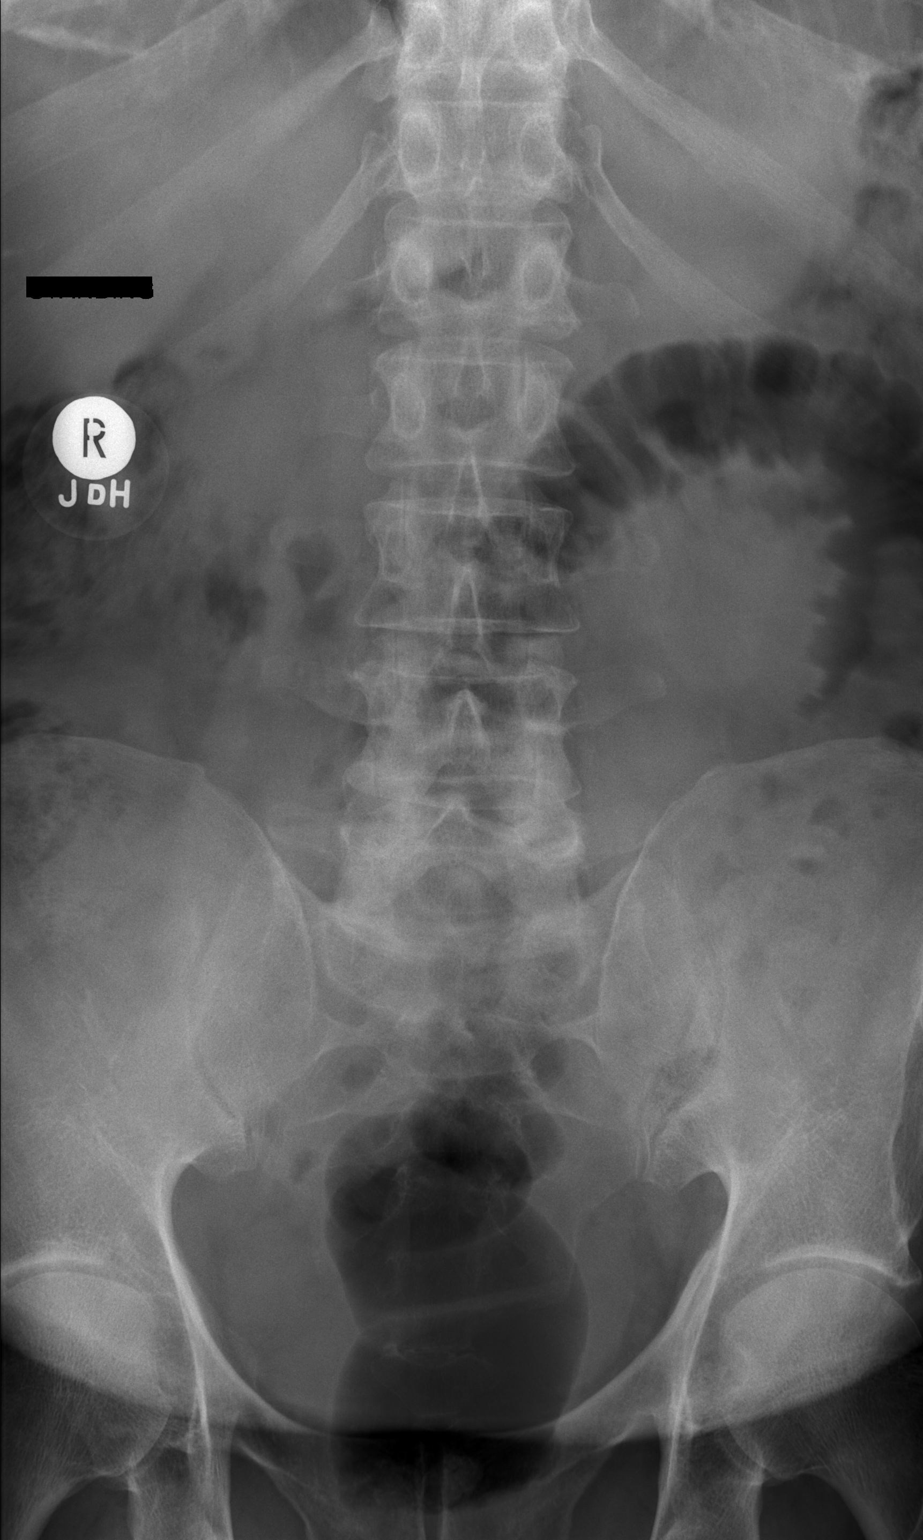

[w lumbar spine lat]
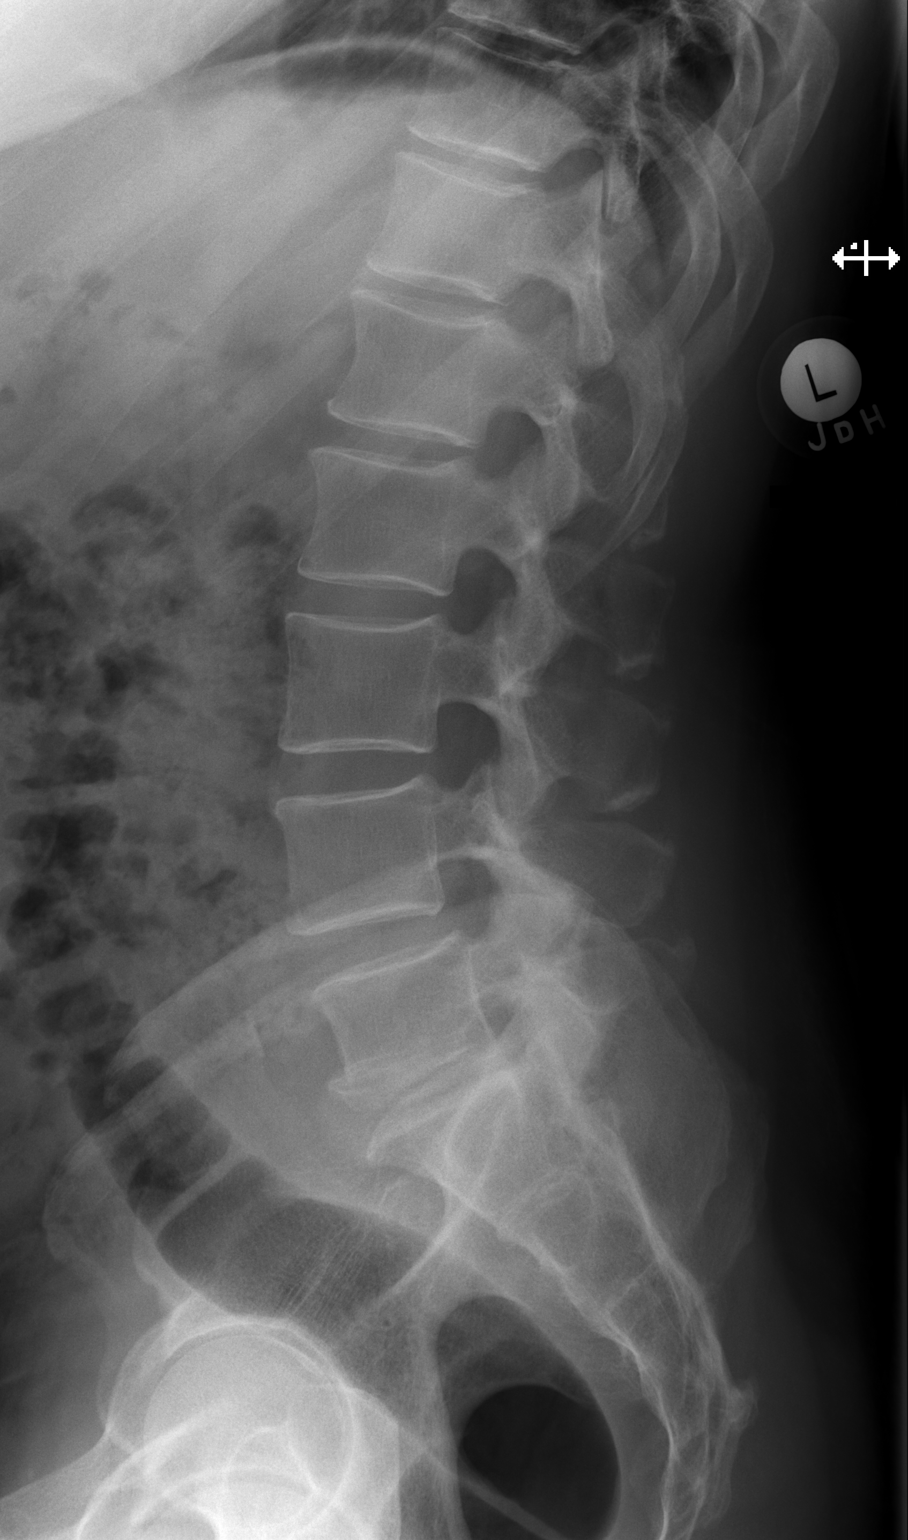

[w lumbar l-5 s-1 spot]
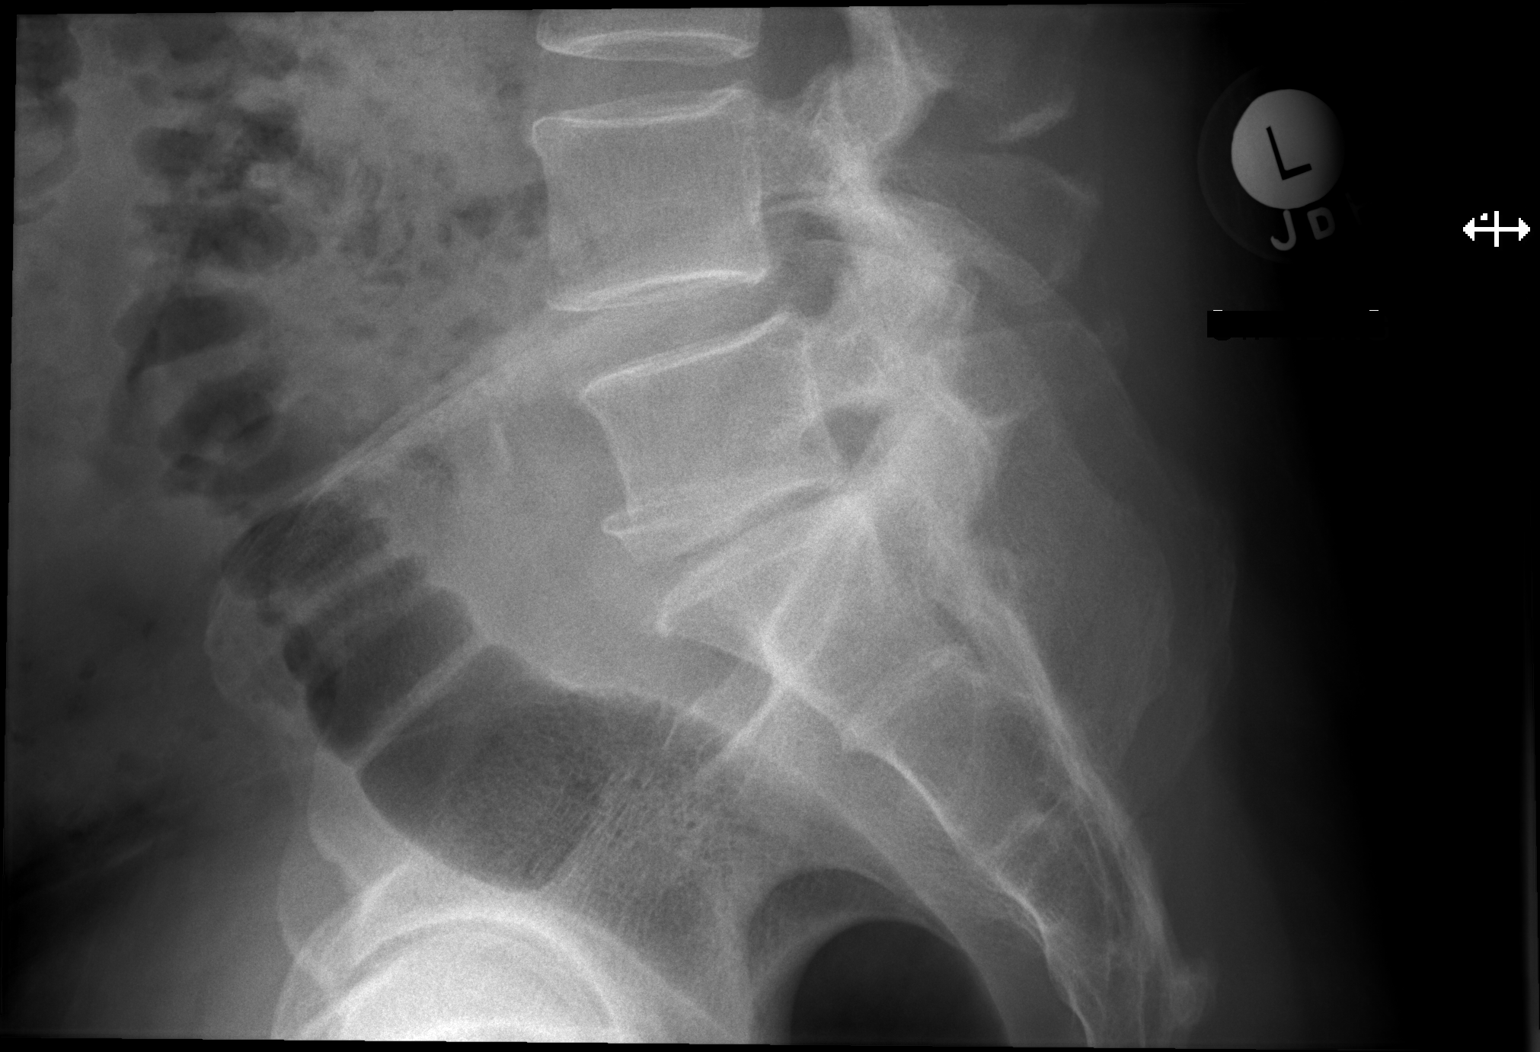

[3 of 3 positions shown; findings below may reference images not displayed]

FINDINGS: There is no evidence of lumbar spine fracture. Alignment is normal.
There is L5-S1 degenerative disc disease with disc space narrowing,
endplate sclerosis and small osteophyte formation. Mild associated
posterior facet arthropathy.
IMPRESSION: L5-S1 degenerative disc disease.

## 2020-11-17 DIAGNOSIS — R197 Diarrhea, unspecified: Secondary | ICD-10-CM | POA: Diagnosis not present

## 2020-11-17 DIAGNOSIS — Z20822 Contact with and (suspected) exposure to covid-19: Secondary | ICD-10-CM | POA: Diagnosis not present

## 2020-11-17 DIAGNOSIS — R6883 Chills (without fever): Secondary | ICD-10-CM | POA: Diagnosis not present

## 2020-12-04 DIAGNOSIS — D649 Anemia, unspecified: Secondary | ICD-10-CM | POA: Diagnosis not present

## 2020-12-04 DIAGNOSIS — E782 Mixed hyperlipidemia: Secondary | ICD-10-CM | POA: Diagnosis not present

## 2020-12-04 DIAGNOSIS — I10 Essential (primary) hypertension: Secondary | ICD-10-CM | POA: Diagnosis not present

## 2020-12-04 DIAGNOSIS — K625 Hemorrhage of anus and rectum: Secondary | ICD-10-CM | POA: Diagnosis not present

## 2020-12-04 DIAGNOSIS — Z0000 Encounter for general adult medical examination without abnormal findings: Secondary | ICD-10-CM | POA: Diagnosis not present

## 2020-12-04 DIAGNOSIS — Z125 Encounter for screening for malignant neoplasm of prostate: Secondary | ICD-10-CM | POA: Diagnosis not present

## 2020-12-04 DIAGNOSIS — N529 Male erectile dysfunction, unspecified: Secondary | ICD-10-CM | POA: Diagnosis not present

## 2020-12-06 DIAGNOSIS — H40033 Anatomical narrow angle, bilateral: Secondary | ICD-10-CM | POA: Diagnosis not present

## 2020-12-06 DIAGNOSIS — H2513 Age-related nuclear cataract, bilateral: Secondary | ICD-10-CM | POA: Diagnosis not present

## 2020-12-25 DIAGNOSIS — K5900 Constipation, unspecified: Secondary | ICD-10-CM | POA: Diagnosis not present

## 2020-12-25 DIAGNOSIS — K625 Hemorrhage of anus and rectum: Secondary | ICD-10-CM | POA: Diagnosis not present

## 2021-01-29 DIAGNOSIS — U071 COVID-19: Secondary | ICD-10-CM | POA: Diagnosis not present

## 2021-02-03 DIAGNOSIS — Z8616 Personal history of COVID-19: Secondary | ICD-10-CM | POA: Diagnosis not present

## 2021-02-25 DIAGNOSIS — U071 COVID-19: Secondary | ICD-10-CM | POA: Diagnosis not present

## 2021-02-25 DIAGNOSIS — J069 Acute upper respiratory infection, unspecified: Secondary | ICD-10-CM | POA: Diagnosis not present

## 2021-02-25 DIAGNOSIS — R197 Diarrhea, unspecified: Secondary | ICD-10-CM | POA: Diagnosis not present

## 2021-02-25 DIAGNOSIS — R509 Fever, unspecified: Secondary | ICD-10-CM | POA: Diagnosis not present

## 2021-03-01 DIAGNOSIS — Z20822 Contact with and (suspected) exposure to covid-19: Secondary | ICD-10-CM | POA: Diagnosis not present

## 2021-03-01 DIAGNOSIS — U071 COVID-19: Secondary | ICD-10-CM | POA: Diagnosis not present

## 2021-05-24 DIAGNOSIS — I10 Essential (primary) hypertension: Secondary | ICD-10-CM | POA: Diagnosis not present

## 2021-05-24 DIAGNOSIS — E782 Mixed hyperlipidemia: Secondary | ICD-10-CM | POA: Diagnosis not present

## 2021-05-24 DIAGNOSIS — N529 Male erectile dysfunction, unspecified: Secondary | ICD-10-CM | POA: Diagnosis not present

## 2021-05-24 DIAGNOSIS — Z23 Encounter for immunization: Secondary | ICD-10-CM | POA: Diagnosis not present

## 2021-09-28 DIAGNOSIS — K625 Hemorrhage of anus and rectum: Secondary | ICD-10-CM | POA: Diagnosis not present

## 2021-09-28 DIAGNOSIS — K5730 Diverticulosis of large intestine without perforation or abscess without bleeding: Secondary | ICD-10-CM | POA: Diagnosis not present

## 2021-09-28 DIAGNOSIS — K621 Rectal polyp: Secondary | ICD-10-CM | POA: Diagnosis not present

## 2021-09-28 DIAGNOSIS — D120 Benign neoplasm of cecum: Secondary | ICD-10-CM | POA: Diagnosis not present

## 2021-09-28 DIAGNOSIS — K648 Other hemorrhoids: Secondary | ICD-10-CM | POA: Diagnosis not present

## 2021-11-09 DIAGNOSIS — F411 Generalized anxiety disorder: Secondary | ICD-10-CM | POA: Diagnosis not present

## 2021-11-16 DIAGNOSIS — F411 Generalized anxiety disorder: Secondary | ICD-10-CM | POA: Diagnosis not present

## 2021-11-23 DIAGNOSIS — F411 Generalized anxiety disorder: Secondary | ICD-10-CM | POA: Diagnosis not present

## 2021-11-30 DIAGNOSIS — F411 Generalized anxiety disorder: Secondary | ICD-10-CM | POA: Diagnosis not present

## 2021-12-07 DIAGNOSIS — F411 Generalized anxiety disorder: Secondary | ICD-10-CM | POA: Diagnosis not present

## 2021-12-13 DIAGNOSIS — F411 Generalized anxiety disorder: Secondary | ICD-10-CM | POA: Diagnosis not present

## 2021-12-22 DIAGNOSIS — F411 Generalized anxiety disorder: Secondary | ICD-10-CM | POA: Diagnosis not present

## 2021-12-28 DIAGNOSIS — F411 Generalized anxiety disorder: Secondary | ICD-10-CM | POA: Diagnosis not present

## 2021-12-31 DIAGNOSIS — Z8616 Personal history of COVID-19: Secondary | ICD-10-CM | POA: Diagnosis not present

## 2021-12-31 DIAGNOSIS — E782 Mixed hyperlipidemia: Secondary | ICD-10-CM | POA: Diagnosis not present

## 2021-12-31 DIAGNOSIS — N529 Male erectile dysfunction, unspecified: Secondary | ICD-10-CM | POA: Diagnosis not present

## 2021-12-31 DIAGNOSIS — I10 Essential (primary) hypertension: Secondary | ICD-10-CM | POA: Diagnosis not present

## 2022-01-04 DIAGNOSIS — F411 Generalized anxiety disorder: Secondary | ICD-10-CM | POA: Diagnosis not present

## 2022-05-17 DIAGNOSIS — M222X2 Patellofemoral disorders, left knee: Secondary | ICD-10-CM | POA: Diagnosis not present

## 2022-05-17 DIAGNOSIS — Z0000 Encounter for general adult medical examination without abnormal findings: Secondary | ICD-10-CM | POA: Diagnosis not present

## 2022-05-17 DIAGNOSIS — Z23 Encounter for immunization: Secondary | ICD-10-CM | POA: Diagnosis not present

## 2022-05-17 DIAGNOSIS — Z125 Encounter for screening for malignant neoplasm of prostate: Secondary | ICD-10-CM | POA: Diagnosis not present

## 2022-05-17 DIAGNOSIS — I10 Essential (primary) hypertension: Secondary | ICD-10-CM | POA: Diagnosis not present

## 2022-06-11 ENCOUNTER — Telehealth: Payer: Self-pay | Admitting: *Deleted

## 2022-06-11 NOTE — Telephone Encounter (Signed)
Patient is having problems w/ legs and feet, working on cement,asking for an appointment to see physician. Please contact.

## 2022-06-14 DIAGNOSIS — M25562 Pain in left knee: Secondary | ICD-10-CM | POA: Diagnosis not present

## 2022-06-14 DIAGNOSIS — M170 Bilateral primary osteoarthritis of knee: Secondary | ICD-10-CM | POA: Diagnosis not present

## 2022-06-14 DIAGNOSIS — M25561 Pain in right knee: Secondary | ICD-10-CM | POA: Diagnosis not present

## 2022-06-20 ENCOUNTER — Ambulatory Visit: Payer: Federal, State, Local not specified - PPO | Admitting: Podiatry

## 2022-06-20 DIAGNOSIS — M170 Bilateral primary osteoarthritis of knee: Secondary | ICD-10-CM | POA: Diagnosis not present

## 2022-09-05 DIAGNOSIS — H2513 Age-related nuclear cataract, bilateral: Secondary | ICD-10-CM | POA: Diagnosis not present

## 2022-09-05 DIAGNOSIS — H40033 Anatomical narrow angle, bilateral: Secondary | ICD-10-CM | POA: Diagnosis not present

## 2022-09-12 DIAGNOSIS — B349 Viral infection, unspecified: Secondary | ICD-10-CM | POA: Diagnosis not present

## 2022-09-12 DIAGNOSIS — U071 COVID-19: Secondary | ICD-10-CM | POA: Diagnosis not present

## 2022-11-19 DIAGNOSIS — N529 Male erectile dysfunction, unspecified: Secondary | ICD-10-CM | POA: Diagnosis not present

## 2022-11-19 DIAGNOSIS — E782 Mixed hyperlipidemia: Secondary | ICD-10-CM | POA: Diagnosis not present

## 2022-11-19 DIAGNOSIS — I10 Essential (primary) hypertension: Secondary | ICD-10-CM | POA: Diagnosis not present

## 2022-11-19 DIAGNOSIS — M179 Osteoarthritis of knee, unspecified: Secondary | ICD-10-CM | POA: Diagnosis not present

## 2023-01-28 DIAGNOSIS — M25562 Pain in left knee: Secondary | ICD-10-CM | POA: Diagnosis not present

## 2023-01-28 DIAGNOSIS — M25561 Pain in right knee: Secondary | ICD-10-CM | POA: Diagnosis not present

## 2023-01-28 DIAGNOSIS — Z1389 Encounter for screening for other disorder: Secondary | ICD-10-CM | POA: Diagnosis not present

## 2023-02-27 DIAGNOSIS — R1030 Lower abdominal pain, unspecified: Secondary | ICD-10-CM | POA: Diagnosis not present

## 2023-03-17 ENCOUNTER — Other Ambulatory Visit: Payer: Self-pay | Admitting: Family Medicine

## 2023-03-17 DIAGNOSIS — R109 Unspecified abdominal pain: Secondary | ICD-10-CM

## 2023-03-18 ENCOUNTER — Ambulatory Visit
Admission: RE | Admit: 2023-03-18 | Discharge: 2023-03-18 | Disposition: A | Discharge status: Home / Self Care | Payer: Federal, State, Local not specified - PPO | Source: Ambulatory Visit | Attending: Family Medicine | Admitting: Family Medicine

## 2023-03-18 DIAGNOSIS — R109 Unspecified abdominal pain: Secondary | ICD-10-CM

## 2023-03-19 DIAGNOSIS — R109 Unspecified abdominal pain: Secondary | ICD-10-CM | POA: Diagnosis not present

## 2023-03-27 DIAGNOSIS — R109 Unspecified abdominal pain: Secondary | ICD-10-CM | POA: Diagnosis not present

## 2023-03-27 DIAGNOSIS — F1010 Alcohol abuse, uncomplicated: Secondary | ICD-10-CM | POA: Diagnosis not present

## 2023-03-27 DIAGNOSIS — K760 Fatty (change of) liver, not elsewhere classified: Secondary | ICD-10-CM | POA: Diagnosis not present

## 2023-04-10 DIAGNOSIS — R319 Hematuria, unspecified: Secondary | ICD-10-CM | POA: Diagnosis not present

## 2023-04-10 DIAGNOSIS — M79605 Pain in left leg: Secondary | ICD-10-CM | POA: Diagnosis not present

## 2023-04-10 DIAGNOSIS — M7989 Other specified soft tissue disorders: Secondary | ICD-10-CM | POA: Diagnosis not present

## 2023-04-11 DIAGNOSIS — M1712 Unilateral primary osteoarthritis, left knee: Secondary | ICD-10-CM | POA: Diagnosis not present

## 2023-04-11 DIAGNOSIS — M79662 Pain in left lower leg: Secondary | ICD-10-CM | POA: Diagnosis not present

## 2023-04-11 DIAGNOSIS — M7989 Other specified soft tissue disorders: Secondary | ICD-10-CM | POA: Diagnosis not present

## 2023-04-22 DIAGNOSIS — M25562 Pain in left knee: Secondary | ICD-10-CM | POA: Diagnosis not present

## 2023-05-01 DIAGNOSIS — M25462 Effusion, left knee: Secondary | ICD-10-CM | POA: Diagnosis not present

## 2023-05-01 DIAGNOSIS — S83242A Other tear of medial meniscus, current injury, left knee, initial encounter: Secondary | ICD-10-CM | POA: Diagnosis not present

## 2023-05-01 DIAGNOSIS — M25562 Pain in left knee: Secondary | ICD-10-CM | POA: Diagnosis not present

## 2023-05-01 DIAGNOSIS — R609 Edema, unspecified: Secondary | ICD-10-CM | POA: Diagnosis not present

## 2023-05-01 DIAGNOSIS — S83272A Complex tear of lateral meniscus, current injury, left knee, initial encounter: Secondary | ICD-10-CM | POA: Diagnosis not present

## 2023-05-26 DIAGNOSIS — Z23 Encounter for immunization: Secondary | ICD-10-CM | POA: Diagnosis not present

## 2023-05-26 DIAGNOSIS — I10 Essential (primary) hypertension: Secondary | ICD-10-CM | POA: Diagnosis not present

## 2023-05-26 DIAGNOSIS — Z0000 Encounter for general adult medical examination without abnormal findings: Secondary | ICD-10-CM | POA: Diagnosis not present

## 2023-05-26 DIAGNOSIS — Z125 Encounter for screening for malignant neoplasm of prostate: Secondary | ICD-10-CM | POA: Diagnosis not present

## 2023-05-26 DIAGNOSIS — E782 Mixed hyperlipidemia: Secondary | ICD-10-CM | POA: Diagnosis not present

## 2023-05-26 DIAGNOSIS — N529 Male erectile dysfunction, unspecified: Secondary | ICD-10-CM | POA: Diagnosis not present

## 2023-05-26 DIAGNOSIS — R748 Abnormal levels of other serum enzymes: Secondary | ICD-10-CM | POA: Diagnosis not present

## 2023-05-26 DIAGNOSIS — R600 Localized edema: Secondary | ICD-10-CM | POA: Diagnosis not present

## 2023-05-26 DIAGNOSIS — D649 Anemia, unspecified: Secondary | ICD-10-CM | POA: Diagnosis not present

## 2023-05-27 DIAGNOSIS — M25562 Pain in left knee: Secondary | ICD-10-CM | POA: Diagnosis not present

## 2023-06-04 DIAGNOSIS — M25562 Pain in left knee: Secondary | ICD-10-CM | POA: Diagnosis not present

## 2023-06-23 DIAGNOSIS — R109 Unspecified abdominal pain: Secondary | ICD-10-CM | POA: Diagnosis not present

## 2023-07-16 DIAGNOSIS — M25562 Pain in left knee: Secondary | ICD-10-CM | POA: Diagnosis not present

## 2023-08-20 DIAGNOSIS — Z03818 Encounter for observation for suspected exposure to other biological agents ruled out: Secondary | ICD-10-CM | POA: Diagnosis not present

## 2023-08-20 DIAGNOSIS — R509 Fever, unspecified: Secondary | ICD-10-CM | POA: Diagnosis not present

## 2023-08-20 DIAGNOSIS — R6883 Chills (without fever): Secondary | ICD-10-CM | POA: Diagnosis not present

## 2023-08-20 DIAGNOSIS — R52 Pain, unspecified: Secondary | ICD-10-CM | POA: Diagnosis not present

## 2023-08-20 DIAGNOSIS — J029 Acute pharyngitis, unspecified: Secondary | ICD-10-CM | POA: Diagnosis not present

## 2023-09-08 DIAGNOSIS — H2513 Age-related nuclear cataract, bilateral: Secondary | ICD-10-CM | POA: Diagnosis not present

## 2023-09-08 DIAGNOSIS — H40033 Anatomical narrow angle, bilateral: Secondary | ICD-10-CM | POA: Diagnosis not present

## 2023-10-21 ENCOUNTER — Ambulatory Visit: Payer: Self-pay | Admitting: Nurse Practitioner

## 2023-11-21 ENCOUNTER — Ambulatory Visit (INDEPENDENT_AMBULATORY_CARE_PROVIDER_SITE_OTHER): Payer: Self-pay | Admitting: Nurse Practitioner

## 2023-11-21 ENCOUNTER — Encounter: Payer: Self-pay | Admitting: Nurse Practitioner

## 2023-11-21 VITALS — BP 119/71 | HR 65 | Temp 97.2°F | Ht 73.0 in | Wt 209.0 lb

## 2023-11-21 DIAGNOSIS — R42 Dizziness and giddiness: Secondary | ICD-10-CM | POA: Diagnosis not present

## 2023-11-21 DIAGNOSIS — K709 Alcoholic liver disease, unspecified: Secondary | ICD-10-CM | POA: Diagnosis not present

## 2023-11-21 DIAGNOSIS — I10 Essential (primary) hypertension: Secondary | ICD-10-CM | POA: Insufficient documentation

## 2023-11-21 DIAGNOSIS — R109 Unspecified abdominal pain: Secondary | ICD-10-CM

## 2023-11-21 DIAGNOSIS — G8929 Other chronic pain: Secondary | ICD-10-CM | POA: Insufficient documentation

## 2023-11-21 DIAGNOSIS — F1090 Alcohol use, unspecified, uncomplicated: Secondary | ICD-10-CM | POA: Insufficient documentation

## 2023-11-21 MED ORDER — OLMESARTAN MEDOXOMIL-HCTZ 40-25 MG PO TABS
1.0000 | ORAL_TABLET | Freq: Every day | ORAL | Status: DC
Start: 2023-11-21 — End: 2024-02-06

## 2023-11-21 NOTE — Assessment & Plan Note (Signed)
 Need to avoid alcohol discussed Patient referred for alcohol abuse counseling, checking labs today, will plan to start patient on disulfiram

## 2023-11-21 NOTE — Assessment & Plan Note (Signed)
 Checking CBC, CMP

## 2023-11-21 NOTE — Assessment & Plan Note (Signed)
 Patient referred to GI.

## 2023-11-21 NOTE — Progress Notes (Signed)
 New Patient Office Visit  Subjective:  Patient ID: Donald Powers, male    DOB: 01/13/1956  Age: 68 y.o. MRN: [id number]  CC:  Chief Complaint  Patient presents with   Establish Care    HPI Donald Powers is a 68 y.o. male  has a past medical history of Alcoholic liver disease (HCC) and Hypertension.  Patient presented establish care for his chronic medical conditions .  Previous PCP Tally Joe MD  Records requested from previous PCP office today  Hypertension.  Currently on olmesartan-hydrochlorothiazide 40-25 mg 1 tablet daily.  Patient denies chest pain, shortness of breath, reports dizziness that started 2 days ago.   Alcohol liver disease.  States that he drinks for relaxation, on the days that he drinks he would have up to 3-5 drinks of whiskey.  He is interested in referral for alcohol abuse counseling.  he reports chronic abdominal pain, he denies nausea, vomiting, hematemesis.  Has bilateral lower extremity edema    Past Medical History:  Diagnosis Date   Alcoholic liver disease (HCC)    Hypertension     History reviewed. No pertinent surgical history.  Family History  Problem Relation Age of Onset   Diabetes Mother    Heart disease Mother     Social History   Socioeconomic History   Marital status: Married    Spouse name: Not on file   Number of children: 2   Years of education: Not on file   Highest education level: Not on file  Occupational History   Not on file  Tobacco Use   Smoking status: Never   Smokeless tobacco: Not on file  Substance and Sexual Activity   Alcohol use: Yes    Comment: beer and liquor, socially   Drug use: No   Sexual activity: Not on file  Other Topics Concern   Not on file  Social History Narrative   Lives with his brother    Social Drivers of Corporate investment banker Strain: Not on file  Food Insecurity: Not on file  Transportation Needs: Not on file  Physical Activity: Not on file  Stress: Not on file   Social Connections: Not on file  Intimate Partner Violence: Not on file    ROS Review of Systems  Objective:   Today's Vitals: BP 119/71   Pulse 65   Temp (!) 97.2 F (36.2 C)   Ht 6\' 1"  (1.854 m)   Wt 209 lb (94.8 kg)   SpO2 99%   BMI 27.57 kg/m   Physical Exam Vitals and nursing note reviewed.  Constitutional:      General: He is not in acute distress.    Appearance: Normal appearance. He is not ill-appearing, toxic-appearing or diaphoretic.  HENT:     Mouth/Throat:     Mouth: Mucous membranes are moist.     Pharynx: Oropharynx is clear. No oropharyngeal exudate or posterior oropharyngeal erythema.  Eyes:     General: No scleral icterus.       Right eye: No discharge.        Left eye: No discharge.     Extraocular Movements: Extraocular movements intact.     Conjunctiva/sclera: Conjunctivae normal.  Cardiovascular:     Rate and Rhythm: Normal rate and regular rhythm.     Pulses: Normal pulses.     Heart sounds: Normal heart sounds. No murmur heard.    No friction rub. No gallop.  Pulmonary:     Effort: Pulmonary effort is normal. No  respiratory distress.     Breath sounds: Normal breath sounds. No stridor. No wheezing, rhonchi or rales.  Chest:     Chest wall: No tenderness.  Abdominal:     General: There is no distension.     Palpations: Abdomen is soft.     Tenderness: There is no abdominal tenderness. There is no right CVA tenderness, left CVA tenderness or guarding.  Musculoskeletal:        General: No swelling, tenderness, deformity or signs of injury.     Right lower leg: Edema present.     Left lower leg: Edema present.     Comments: Nonpitting edema bilateral lower extremities  Skin:    General: Skin is warm and dry.     Capillary Refill: Capillary refill takes less than 2 seconds.     Coloration: Skin is not jaundiced or pale.     Findings: No bruising, erythema or lesion.  Neurological:     Mental Status: He is alert and oriented to person,  place, and time.     Motor: No weakness.     Coordination: Coordination normal.     Gait: Gait normal.  Psychiatric:        Mood and Affect: Mood normal.        Behavior: Behavior normal.        Thought Content: Thought content normal.        Judgment: Judgment normal.     Assessment & Plan:   Problem List Items Addressed This Visit       Cardiovascular and Mediastinum   Primary hypertension - Primary   Blood pressure is normal in the office today, continue Benicar-HCTZ 40-25 mg 1 tablet daily DASH diet and commitment to daily physical activity for a minimum of 30 minutes discussed and encouraged, as a part of hypertension management. Checking CMP     11/21/2023   12:08 PM 12/14/2015    3:33 PM 08/07/2015   11:05 AM 03/04/2015    2:25 PM 03/04/2015   11:40 AM  BP/Weight  Systolic BP 119 145 128 128 132  Diastolic BP 71 94 80 89 85  Wt. (Lbs) 209      BMI 27.57 kg/m2               Relevant Medications   sildenafil (REVATIO) 20 MG tablet   olmesartan-hydrochlorothiazide (BENICAR HCT) 40-25 MG tablet   Other Relevant Orders   CMP14+EGFR     Digestive   Alcoholic liver disease (HCC)   Need to avoid alcohol discussed Patient referred for alcohol abuse counseling, checking labs today, will plan to start patient on disulfiram      Relevant Orders   Ambulatory referral to Gastroenterology     Other   Alcohol use disorder   Relevant Orders   Ambulatory referral to Psychiatry   Dizziness   Checking CBC, CMP      Relevant Orders   CBC   Chronic abdominal pain   Patient referred to GI      Relevant Medications   celecoxib (CELEBREX) 200 MG capsule   Other Relevant Orders   Ambulatory referral to Gastroenterology    Outpatient Encounter Medications as of 11/21/2023  Medication Sig   celecoxib (CELEBREX) 200 MG capsule Take 200 mg by mouth 2 (two) times daily as needed.   olmesartan-hydrochlorothiazide (BENICAR HCT) 40-25 MG tablet Take 1 tablet by mouth  daily.   [DISCONTINUED] olmesartan-hydrochlorothiazide (BENICAR HCT) 40-12.5 MG tablet Take 1 tablet by mouth daily.  polyethylene glycol powder (MIRALAX) 17 GM/SCOOP powder i capful Orally Once a day; Mix in a drink for 30 days (Patient not taking: Reported on 11/21/2023)   Pramoxine-HC (HYDROCORTISONE ACE-PRAMOXINE) 2.5-1 % CREA 1 application as needed Externally Three times a day as needed for rectal pain for 14 days (Patient not taking: Reported on 11/21/2023)   sildenafil (REVATIO) 20 MG tablet 2-5 tablets one hour prior to need Orally as directed (Patient not taking: Reported on 11/21/2023)   [DISCONTINUED] ibuprofen (ADVIL,MOTRIN) 200 MG tablet Take 400 mg by mouth every 4 (four) hours as needed for fever, headache, mild pain, moderate pain or cramping. (Patient not taking: Reported on 11/21/2023)   [DISCONTINUED] meloxicam (MOBIC) 7.5 MG tablet Take 1 tablet (7.5 mg total) by mouth daily. (Patient not taking: Reported on 11/21/2023)   [DISCONTINUED] naproxen (NAPROSYN) 500 MG tablet Take 500 mg by mouth every 12 (twelve) hours as needed. for pain (Patient not taking: Reported on 11/21/2023)   [DISCONTINUED] neomycin-polymyxin-hydrocortisone (CORTISPORIN) OTIC solution Apply 1-2 drops to toe after soaking BID (Patient not taking: Reported on 11/21/2023)   [DISCONTINUED] neomycin-polymyxin-hydrocortisone (CORTISPORIN) OTIC solution Apply 1-2 drops to toe after soaking twice a day (Patient not taking: Reported on 11/21/2023)   [DISCONTINUED] valsartan-hydrochlorothiazide (DIOVAN-HCT) 320-12.5 MG per tablet Take 1 tablet by mouth daily. (Patient not taking: Reported on 11/21/2023)   No facility-administered encounter medications on file as of 11/21/2023.    Follow-up: Return in about 4 months (around 03/22/2024) for HTN.   Donell Beers, FNP

## 2023-11-21 NOTE — Assessment & Plan Note (Signed)
 Blood pressure is normal in the office today, continue Benicar-HCTZ 40-25 mg 1 tablet daily DASH diet and commitment to daily physical activity for a minimum of 30 minutes discussed and encouraged, as a part of hypertension management. Checking CMP     11/21/2023   12:08 PM 12/14/2015    3:33 PM 08/07/2015   11:05 AM 03/04/2015    2:25 PM 03/04/2015   11:40 AM  BP/Weight  Systolic BP 119 145 128 128 132  Diastolic BP 71 94 80 89 85  Wt. (Lbs) 209      BMI 27.57 kg/m2

## 2023-11-21 NOTE — Patient Instructions (Signed)
 1. Primary hypertension (Primary)  - olmesartan-hydrochlorothiazide (BENICAR HCT) 40-25 MG tablet; Take 1 tablet by mouth daily. - CMP14+EGFR  2. Dizziness  - CBC  3. Alcohol use disorder  - Ambulatory referral to Psychiatry  4. Alcoholic liver disease (HCC)  - Ambulatory referral to Gastroenterology  5. Chronic abdominal pain  - Ambulatory referral to Gastroenterology    It is important that you exercise regularly at least 30 minutes 5 times a week as tolerated  Think about what you will eat, plan ahead. Choose " clean, green, fresh or frozen" over canned, processed or packaged foods which are more sugary, salty and fatty. 70 to 75% of food eaten should be vegetables and fruit. Three meals at set times with snacks allowed between meals, but they must be fruit or vegetables. Aim to eat over a 12 hour period , example 7 am to 7 pm, and STOP after  your last meal of the day. Drink water,generally about 64 ounces per day, no other drink is as healthy. Fruit juice is best enjoyed in a healthy way, by EATING the fruit.  Thanks for choosing [facility] we consider it a privelige to serve you.

## 2023-11-22 LAB — CMP14+EGFR
ALT: 17 IU/L (ref 0–44)
AST: 19 IU/L (ref 0–40)
Albumin: 4.3 g/dL (ref 3.9–4.9)
Alkaline Phosphatase: 77 IU/L (ref 44–121)
BUN/Creatinine Ratio: 19 (ref 10–24)
BUN: 20 mg/dL (ref 8–27)
Bilirubin Total: 0.3 mg/dL (ref 0.0–1.2)
CO2: 26 mmol/L (ref 20–29)
Calcium: 9.4 mg/dL (ref 8.6–10.2)
Chloride: 101 mmol/L (ref 96–106)
Creatinine, Ser: 1.06 mg/dL (ref 0.76–1.27)
Globulin, Total: 2.4 g/dL (ref 1.5–4.5)
Glucose: 88 mg/dL (ref 70–99)
Potassium: 4.8 mmol/L (ref 3.5–5.2)
Sodium: 140 mmol/L (ref 134–144)
Total Protein: 6.7 g/dL (ref 6.0–8.5)
eGFR: 78 mL/min/{1.73_m2} (ref >59)

## 2023-11-22 LAB — CBC
Hematocrit: 41.6 % (ref 37.5–51.0)
Hemoglobin: 13.6 g/dL (ref 13.0–17.7)
MCH: 26.4 pg — ABNORMAL LOW (ref 26.6–33.0)
MCHC: 32.7 g/dL (ref 31.5–35.7)
MCV: 81 fL (ref 79–97)
Platelets: 195 10*3/uL (ref 150–450)
RBC: 5.16 x10E6/uL (ref 4.14–5.80)
RDW: 14.9 % (ref 11.6–15.4)
WBC: 4.2 10*3/uL (ref 3.4–10.8)

## 2023-11-24 ENCOUNTER — Telehealth: Payer: Self-pay

## 2023-11-24 ENCOUNTER — Other Ambulatory Visit: Payer: Self-pay | Admitting: Nurse Practitioner

## 2023-11-24 DIAGNOSIS — F1090 Alcohol use, unspecified, uncomplicated: Secondary | ICD-10-CM

## 2023-11-24 MED ORDER — NALTREXONE HCL 50 MG PO TABS
50.0000 mg | ORAL_TABLET | Freq: Every day | ORAL | 1 refills | Status: DC
Start: 2023-11-24 — End: 2024-03-22

## 2023-11-24 NOTE — Telephone Encounter (Signed)
 Copied from CRM [id number]. Topic: Clinical - Lab/Test Results >> Nov 24, 2023 12:44 PM Fuller Mandril wrote: Reason for CRM: Patient called about recent labs. Would like to know results. No note from provider yet. Thank You  LVM for call back . KH

## 2023-11-26 ENCOUNTER — Ambulatory Visit: Payer: Self-pay | Admitting: Nurse Practitioner

## 2023-11-26 ENCOUNTER — Telehealth: Payer: Self-pay | Admitting: Nurse Practitioner

## 2023-11-26 NOTE — Telephone Encounter (Signed)
 Patient returned call - pls call him back .Marland Kitchen Unsure why we are leaving messages

## 2023-11-26 NOTE — Telephone Encounter (Signed)
 Copied from CRM [id number]. Topic: General - Other >> Nov 26, 2023 12:28 PM Tiffany B wrote: Reason for CRM: Patient returning call, patient states he is aware of his most recent lab results. Unsure its regarding scheduling appointment with the [facility]. Chart does not reflect the practice reached out to him.

## 2023-11-27 ENCOUNTER — Encounter: Payer: Self-pay | Admitting: Gastroenterology

## 2023-11-27 NOTE — Telephone Encounter (Signed)
 disreguard

## 2023-12-30 ENCOUNTER — Ambulatory Visit: Payer: Self-pay | Admitting: Nurse Practitioner

## 2024-01-19 ENCOUNTER — Ambulatory Visit: Admitting: Gastroenterology

## 2024-01-19 ENCOUNTER — Encounter: Payer: Self-pay | Admitting: Gastroenterology

## 2024-01-19 ENCOUNTER — Telehealth: Payer: Self-pay

## 2024-01-19 VITALS — BP 122/80 | HR 71 | Ht 73.0 in | Wt 214.0 lb

## 2024-01-19 DIAGNOSIS — F1020 Alcohol dependence, uncomplicated: Secondary | ICD-10-CM

## 2024-01-19 DIAGNOSIS — R140 Abdominal distension (gaseous): Secondary | ICD-10-CM

## 2024-01-19 DIAGNOSIS — K760 Fatty (change of) liver, not elsewhere classified: Secondary | ICD-10-CM | POA: Diagnosis not present

## 2024-01-19 DIAGNOSIS — Z1211 Encounter for screening for malignant neoplasm of colon: Secondary | ICD-10-CM

## 2024-01-19 NOTE — Patient Instructions (Signed)
 _______________________________________________________  If your blood pressure at your visit was 140/90 or greater, please contact your primary care physician to follow up on this. _______________________________________________________  If you are age 66 or older, your body mass index should be between 23-30. Your Body mass index is 28.23 kg/m. If this is out of the aforementioned range listed, please consider follow up with your Primary Care Provider. ________________________________________________________  The [facility] providers would like to encourage you to use MYCHART to communicate with providers for non-urgent requests or questions.  Due to long hold times on the telephone, sending your provider a message by [facility] may be a faster and more efficient way to get a response.  Please allow 48 business hours for a response.  Please remember that this is for non-urgent requests.  _______________________________________________________  Thank you for entrusting me with your care and choosing [facility].  [place], PA-C

## 2024-01-19 NOTE — Progress Notes (Signed)
 Chief Complaint: Bloating Primary GI MD: Para Bold  HPI: 68 year old male with history of hypertension and alcoholism presents for evaluation of bloating  Patient recently seen by PCP 11/21/2023 for alcoholism and was started on disulfiram and referred to GI.  CBC and CMP with normal platelets and LFTs without evidence of anemia  RUQ ultrasound 03/18/2023 with hepatic steatosis  Discussed the use of AI scribe software for clinical note transcription with the patient, who gave verbal consent to proceed.  History of Present Illness He experiences bloating, particularly after consuming alcohol, and has reduced his intake to two to three food liquor drinks on weekends. He associates the bloating with alcohol, especially when drinking liquor on ice. Additionally, he consumes a lot of coffee and tea, which may contribute to bloating.  He has not been taking medication prescribed to stop alcohol consumption due to concerns about potential mental health side effects.  He underwent a colonoscopy in October 2024 with [facility], which reportedly revealed polyps that were removed. He was advised to return for another colonoscopy in five years. No issues with bowel movements or rectal bleeding.  He notices his symptoms are more prevalent after drinking alcohol throughout the weekend.  No history of pancreatitis and his pancreas appeared normal on his last scan.   Past Medical History:  Diagnosis Date   Alcoholic liver disease (HCC)    Hypertension     History reviewed. No pertinent surgical history.  Current Outpatient Medications  Medication Sig Dispense Refill   olmesartan -hydrochlorothiazide (BENICAR  HCT) 40-25 MG tablet Take 1 tablet by mouth daily.     polyethylene glycol powder (MIRALAX) 17 GM/SCOOP powder      celecoxib (CELEBREX) 200 MG capsule Take 200 mg by mouth 2 (two) times daily as needed. (Patient not taking: Reported on 01/19/2024)     naltrexone  (DEPADE) 50 MG tablet Take 1  tablet (50 mg total) by mouth daily. (Patient not taking: Reported on 01/19/2024) 30 tablet 1   Pramoxine-HC (HYDROCORTISONE ACE-PRAMOXINE) 2.5-1 % CREA 1 application as needed Externally Three times a day as needed for rectal pain for 14 days (Patient not taking: Reported on 01/19/2024)     sildenafil (REVATIO) 20 MG tablet 2-5 tablets one hour prior to need Orally as directed (Patient not taking: Reported on 01/19/2024)     No current facility-administered medications for this visit.    Allergies as of 01/19/2024   (No Known Allergies)    Family History  Problem Relation Age of Onset   Diabetes Mother    Heart disease Mother    Liver disease Neg Hx    Colon cancer Neg Hx    Esophageal cancer Neg Hx     Social History   Socioeconomic History   Marital status: Divorced    Spouse name: Not on file   Number of children: 2   Years of education: Not on file   Highest education level: Not on file  Occupational History   Not on file  Tobacco Use   Smoking status: Never   Smokeless tobacco: Not on file  Vaping Use   Vaping status: Never Used  Substance and Sexual Activity   Alcohol use: Yes    Comment: beer and liquor, socially   Drug use: No   Sexual activity: Not on file  Other Topics Concern   Not on file  Social History Narrative   Lives with his brother    Social Drivers of Corporate investment banker Strain: Not on file  Food Insecurity: Not on file  Transportation Needs: Not on file  Physical Activity: Not on file  Stress: Not on file  Social Connections: Not on file  Intimate Partner Violence: Not on file    Review of Systems:    Constitutional: No weight loss, fever, chills, weakness or fatigue HEENT: Eyes: No change in vision               Ears, Nose, Throat:  No change in hearing or congestion Skin: No rash or itching Cardiovascular: No chest pain, chest pressure or palpitations   Respiratory: No SOB or cough Gastrointestinal: See HPI and otherwise  negative Genitourinary: No dysuria or change in urinary frequency Neurological: No headache, dizziness or syncope Musculoskeletal: No new muscle or joint pain Hematologic: No bleeding or bruising Psychiatric: No history of depression or anxiety    Physical Exam:  Vital signs: BP 122/80   Pulse 71   Ht 6\' 1"  (1.854 m)   Wt 214 lb (97.1 kg)   BMI 28.23 kg/m   Constitutional: NAD, alert and cooperative Head:  Normocephalic and atraumatic. Eyes:   PEERL, EOMI. No icterus. Conjunctiva pink. Respiratory: Respirations even and unlabored. Lungs clear to auscultation bilaterally.   No wheezes, crackles, or rhonchi.  Cardiovascular:  Regular rate and rhythm. No peripheral edema, cyanosis or pallor.  Gastrointestinal:  Soft, nondistended, nontender. No rebound or guarding. Normal bowel sounds. No appreciable masses or hepatomegaly. Rectal:  Declines Msk:  Symmetrical without gross deformities. Without edema, no deformity or joint abnormality.  Neurologic:  Alert and  oriented x4;  grossly normal neurologically.  Skin:   Dry and intact without significant lesions or rashes. Psychiatric: Oriented to person, place and time. Demonstrates good judgement and reason without abnormal affect or behaviors.  RELEVANT LABS AND IMAGING: CBC    Component Value Date/Time   WBC 4.2 11/21/2023 1304   WBC 4.5 03/04/2015 1237   RBC 5.16 11/21/2023 1304   RBC 5.09 03/04/2015 1237   HGB 13.6 11/21/2023 1304   HCT 41.6 11/21/2023 1304   PLT 195 11/21/2023 1304   MCV 81 11/21/2023 1304   MCH 26.4 (L) 11/21/2023 1304   MCH 26.9 03/04/2015 1237   MCHC 32.7 11/21/2023 1304   MCHC 33.3 03/04/2015 1237   RDW 14.9 11/21/2023 1304   LYMPHSABS 1.9 03/04/2015 1237   MONOABS 0.5 03/04/2015 1237   EOSABS 0.1 03/04/2015 1237   BASOSABS 0.0 03/04/2015 1237    CMP     Component Value Date/Time   NA 140 11/21/2023 1304   K 4.8 11/21/2023 1304   CL 101 11/21/2023 1304   CO2 26 11/21/2023 1304   GLUCOSE 88  11/21/2023 1304   GLUCOSE 98 03/04/2015 1237   BUN 20 11/21/2023 1304   CREATININE 1.06 11/21/2023 1304   CALCIUM 9.4 11/21/2023 1304   PROT 6.7 11/21/2023 1304   ALBUMIN 4.3 11/21/2023 1304   AST 19 11/21/2023 1304   ALT 17 11/21/2023 1304   ALKPHOS 77 11/21/2023 1304   BILITOT 0.3 11/21/2023 1304   GFRNONAA >60 03/04/2015 1237   GFRAA >60 03/04/2015 1237     Assessment/Plan:   Hepatic steatosis Alcoholism  Normal CBC/CMP.  RUQ ultrasound showing hepatic steatosis 03/2023.  History of alcoholism currently with 2-3 liquor drinks on the weekends (previously daily drinker)   Fib 4 score 1.54. normal LFTs. Patients between 1.45 and 3.25 indicate moderate fibrosis and further workup -- elastography for further evaluation -- advised on complete cessation about all use - Provided  education on hepatic steatosis and patient education handouts  Bloating History of bloating only occurring with alcohol intake.  No abdominal pain, nausea, vomiting, change in bowel habits. - Recommend cessation of alcohol  Colon cancer screening Reported colonoscopy fall 2024 with [facility] with reported history of polyps..  Will obtain records. [facility]  Assigned to Dr. Cherryl Corona today  Suzanna Erp, PA-C [facility] 01/19/2024, 2:39 PM  Cc: [name], [name], FNP

## 2024-01-19 NOTE — Telephone Encounter (Signed)
 Copied from CRM [id number]. Topic: Appointments - Appointment Scheduling >> Jan 19, 2024 12:21 PM Emylou G wrote: Pls call patient looking for referral for foot doctor.. having trouble while standing up at work And to please renew his bp meds

## 2024-01-19 NOTE — Telephone Encounter (Signed)
Lvm for pt to advise KH.

## 2024-01-22 ENCOUNTER — Other Ambulatory Visit: Payer: Self-pay

## 2024-01-22 DIAGNOSIS — K760 Fatty (change of) liver, not elsewhere classified: Secondary | ICD-10-CM

## 2024-01-22 NOTE — Addendum Note (Signed)
 Addended by: Diantha Fossa on: 01/22/2024 02:37 PM   Modules accepted: Orders

## 2024-01-29 ENCOUNTER — Ambulatory Visit (HOSPITAL_BASED_OUTPATIENT_CLINIC_OR_DEPARTMENT_OTHER)
Admission: RE | Admit: 2024-01-29 | Discharge: 2024-01-29 | Disposition: A | Discharge status: Home / Self Care | Source: Ambulatory Visit | Attending: Gastroenterology | Admitting: Gastroenterology

## 2024-01-29 DIAGNOSIS — K760 Fatty (change of) liver, not elsewhere classified: Secondary | ICD-10-CM | POA: Diagnosis present

## 2024-02-02 ENCOUNTER — Ambulatory Visit: Payer: Self-pay | Admitting: Gastroenterology

## 2024-02-03 ENCOUNTER — Telehealth: Payer: Self-pay | Admitting: Gastroenterology

## 2024-02-03 NOTE — Telephone Encounter (Signed)
 Noted. Pt informed and verbalized understanding.

## 2024-02-03 NOTE — Telephone Encounter (Signed)
 Inbound call from patient requesting to know if there was any fatty tissue found on his liver in recent US . Please advise, thank you.

## 2024-02-05 NOTE — Progress Notes (Signed)
 Agree with the assessment and plan as outlined by Boone Master, PA-C.

## 2024-02-06 ENCOUNTER — Ambulatory Visit: Payer: Self-pay | Admitting: Nurse Practitioner

## 2024-02-06 ENCOUNTER — Encounter: Payer: Self-pay | Admitting: Nurse Practitioner

## 2024-02-06 VITALS — BP 128/67 | HR 66 | Temp 97.2°F | Wt 220.0 lb

## 2024-02-06 DIAGNOSIS — R600 Localized edema: Secondary | ICD-10-CM | POA: Diagnosis not present

## 2024-02-06 DIAGNOSIS — F1090 Alcohol use, unspecified, uncomplicated: Secondary | ICD-10-CM

## 2024-02-06 DIAGNOSIS — M1711 Unilateral primary osteoarthritis, right knee: Secondary | ICD-10-CM | POA: Diagnosis not present

## 2024-02-06 DIAGNOSIS — M1712 Unilateral primary osteoarthritis, left knee: Secondary | ICD-10-CM | POA: Diagnosis not present

## 2024-02-06 DIAGNOSIS — I10 Essential (primary) hypertension: Secondary | ICD-10-CM

## 2024-02-06 DIAGNOSIS — M170 Bilateral primary osteoarthritis of knee: Secondary | ICD-10-CM | POA: Diagnosis not present

## 2024-02-06 MED ORDER — KETOROLAC TROMETHAMINE 30 MG/ML IJ SOLN
30.0000 mg | Freq: Once | INTRAMUSCULAR | Status: AC
Start: 2024-02-06 — End: 2024-02-06
  Administered 2024-02-06: 30 mg via INTRAMUSCULAR

## 2024-02-06 MED ORDER — METHYLPREDNISOLONE 4 MG PO TBPK
ORAL_TABLET | ORAL | 0 refills | Status: DC
Start: 2024-02-06 — End: 2024-03-22

## 2024-02-06 MED ORDER — METHYLPREDNISOLONE NA SUC (PF) 40 MG IJ SOLR
40.0000 mg | Freq: Once | INTRAMUSCULAR | Status: AC
Start: 2024-02-06 — End: 2024-02-06
  Administered 2024-02-06: 40 mg via INTRAMUSCULAR

## 2024-02-06 MED ORDER — OLMESARTAN MEDOXOMIL-HCTZ 40-25 MG PO TABS
1.0000 | ORAL_TABLET | Freq: Every day | ORAL | 1 refills | Status: DC
Start: 2024-02-06 — End: 2024-07-23

## 2024-02-06 NOTE — Assessment & Plan Note (Signed)
 Wants to be referred to an orthopedics within the call health system, referral placed Start methylprednisolone tomorrow take with food ,avoid taking ibuprofen, Aleve  while taking methylprednisolone Take Tylenol 650 mg every 6 hours as needed - methylPREDNISolone (MEDROL DOSEPAK) 4 MG TBPK tablet; Please take as instructed on the packaging.  Take with food  Dispense: 1 each; Refill: 0 - Ambulatory referral to Orthopedic Surgery - ketorolac  (TORADOL ) 30 MG/ML injection 30 mg - methylPREDNISolone sodium succinate (SOLU-MEDROL) 40 MG injection 40 mg

## 2024-02-06 NOTE — Patient Instructions (Addendum)
 For the swelling in your lower extremities, be sure to elevate your legs when able, mind the salt intake, stay physically active and consider wearing compression stockings.  I have referred you to the vascular specialist  You were given Toradol  30 mg injection and Solu-Medrol 40 mg injection in the office today.  Please start taking methylprednisolone from tomorrow.  Take medication with food.  Do not take Celebrex Aleve  or ibuprofen when taking prednisone.  Okay to take Tylenol 650 mg every 6 hours as needed.  I also encourage you to use knee brace which you can get from the pharmacy  Please follow-up with the orthopedics    1. Lower extremity edema (Primary)  - Ambulatory referral to Vascular Surgery  2. Chronic pain of left knee  - methylPREDNISolone (MEDROL DOSEPAK) 4 MG TBPK tablet; Please take as instructed on the packaging.  Take with food  Dispense: 1 each; Refill: 0  3. Chronic pain of right knee  - methylPREDNISolone (MEDROL DOSEPAK) 4 MG TBPK tablet; Please take as instructed on the packaging.  Take with food  Dispense: 1 each; Refill: 0

## 2024-02-06 NOTE — Assessment & Plan Note (Signed)
 Checking BNP Patient referred to vascular specialist DASH diet, elevation and use of compression socks discussed

## 2024-02-06 NOTE — Progress Notes (Deleted)
   Acute Office Visit  Subjective:     Patient ID: Donald Powers, male    DOB: April 21, 1956, 68 y.o.   MRN: [id number]  Chief Complaint  Patient presents with   Medical Management of Chronic Issues   Leg Pain    Front and back  pain scale 10    HPI Patient is in today for    Works on concretes and always standing, bo. From feet to knees  down to the legs . Stiffness.  A little bettre after walking. Stiffness , has not tried any med  Swelling started 2 months ago. Both feet . No chest pain , SOB   Drinks off and on. [place] 2 when he drinks . Used to do every day . Never took natroxone.    + 2 on the right   Review of Systems      Objective:    BP 128/67   Pulse 66   Temp (!) 97.2 F (36.2 C)   Wt 220 lb (99.8 kg)   SpO2 97%   BMI 29.03 kg/m  {Vitals History (Optional):23777}  Physical Exam  No results found for any visits on 02/06/24.      Assessment & Plan:   Problem List Items Addressed This Visit   None   No orders of the defined types were placed in this encounter.   No follow-ups on file.  Constantinos Krempasky R Farheen Pfahler, FNP

## 2024-02-06 NOTE — Assessment & Plan Note (Signed)
 Cessation encouraged

## 2024-02-06 NOTE — Progress Notes (Signed)
 Established Patient Office Visit  Subjective:  Patient ID: Donald Powers, male    DOB: [birth year]  Age: 68 y.o. MRN: [id number]  CC:  Chief Complaint  Patient presents with   Medical Management of Chronic Issues   Leg Pain    Front and back  pain scale 10    HPI Donald Powers is a 68 y.o. male  has a past medical history of Alcoholic liver disease (HCC) and Hypertension.  Patient presents for follow-up for alcohol use disorder  Alcohol use disorder.  He was prescribed naltrexone  but he did not start the medication and he is not interested in starting the medication.  States that he continues to drink on and off, no more than 2 drinks of alcohol daily.  Stated that he used to drink every day before. currently denies abdominal pain nausea, vomiting   Chronic bilateral knee pain.  Patient complains of chronic bilateral knee pain, he is job requires walking on concrete and standing, has stiffness in both knee joints that improves after walking.  Was on Celebrex but not taking the medication currently.  Was seen orthopedics has had steroid joint injections in the past.  His pain is currently rated 10/10  Bilateral lower extremity edema .patient complains of bilateral lower extremity swelling, swelling is worse on the right.  He denies chest pain, shortness of breath.  Had an ultrasound done some months ago that was negative for DVT.          Past Medical History:  Diagnosis Date   Alcoholic liver disease (HCC)    Hypertension     History reviewed. No pertinent surgical history.  Family History  Problem Relation Age of Onset   Diabetes Mother    Heart disease Mother    Liver disease Neg Hx    Colon cancer Neg Hx    Esophageal cancer Neg Hx     Social History   Socioeconomic History   Marital status: Divorced    Spouse name: Not on file   Number of children: 2   Years of education: Not on file   Highest education level: Not on file  Occupational History   Not on  file  Tobacco Use   Smoking status: Never   Smokeless tobacco: Not on file  Vaping Use   Vaping status: Never Used  Substance and Sexual Activity   Alcohol use: Yes    Comment: beer and liquor, socially   Drug use: No   Sexual activity: Not on file  Other Topics Concern   Not on file  Social History Narrative   Lives with his brother    Social Drivers of Corporate investment banker Strain: Not on file  Food Insecurity: Not on file  Transportation Needs: Not on file  Physical Activity: Not on file  Stress: Not on file  Social Connections: Not on file  Intimate Partner Violence: Not on file    Outpatient Medications Prior to Visit  Medication Sig Dispense Refill   olmesartan -hydrochlorothiazide (BENICAR  HCT) 40-25 MG tablet Take 1 tablet by mouth daily.     celecoxib (CELEBREX) 200 MG capsule Take 200 mg by mouth 2 (two) times daily as needed. (Patient not taking: Reported on 02/06/2024)     naltrexone  (DEPADE) 50 MG tablet Take 1 tablet (50 mg total) by mouth daily. (Patient not taking: Reported on 02/06/2024) 30 tablet 1   polyethylene glycol powder (MIRALAX) 17 GM/SCOOP powder  (Patient not taking: Reported on 02/06/2024)  Pramoxine-HC (HYDROCORTISONE ACE-PRAMOXINE) 2.5-1 % CREA  (Patient not taking: Reported on 02/06/2024)     sildenafil (REVATIO) 20 MG tablet  (Patient not taking: Reported on 02/06/2024)     No facility-administered medications prior to visit.    No Known Allergies  ROS Review of Systems  Constitutional:  Negative for appetite change, chills, fatigue and fever.  HENT:  Negative for congestion, postnasal drip, rhinorrhea and sneezing.   Respiratory:  Negative for cough, shortness of breath and wheezing.   Cardiovascular:  Positive for leg swelling. Negative for chest pain and palpitations.  Gastrointestinal:  Negative for abdominal pain, constipation, nausea and vomiting.  Genitourinary:  Negative for difficulty urinating, dysuria, flank pain and  frequency.  Musculoskeletal:  Positive for arthralgias. Negative for back pain, joint swelling and myalgias.  Skin:  Negative for color change, pallor, rash and wound.  Neurological:  Negative for dizziness, facial asymmetry, weakness, numbness and headaches.  Psychiatric/Behavioral:  Negative for behavioral problems, confusion, self-injury and suicidal ideas.       Objective:     Physical Exam Vitals and nursing note reviewed.  Constitutional:      General: He is not in acute distress.    Appearance: Normal appearance. He is not ill-appearing, toxic-appearing or diaphoretic.  Eyes:     General: No scleral icterus.       Right eye: No discharge.        Left eye: No discharge.     Extraocular Movements: Extraocular movements intact.     Conjunctiva/sclera: Conjunctivae normal.  Cardiovascular:     Rate and Rhythm: Normal rate and regular rhythm.     Pulses: Normal pulses.     Heart sounds: Normal heart sounds. No murmur heard.    No friction rub. No gallop.  Pulmonary:     Effort: Pulmonary effort is normal. No respiratory distress.     Breath sounds: Normal breath sounds. No stridor. No wheezing, rhonchi or rales.  Chest:     Chest wall: No tenderness.  Abdominal:     General: There is no distension.     Palpations: Abdomen is soft.     Tenderness: There is no abdominal tenderness. There is no right CVA tenderness, left CVA tenderness or guarding.  Musculoskeletal:        General: Tenderness present. No swelling, deformity or signs of injury.     Right lower leg: Edema present.     Left lower leg: Edema present.     Comments: Tenderness on range of motion of bilateral knee, no redness noted has palpable pedal pulses Edema + 2 on the right , +1 on the left   Skin:    General: Skin is warm and dry.     Capillary Refill: Capillary refill takes less than 2 seconds.     Coloration: Skin is not jaundiced or pale.     Findings: No bruising, erythema or lesion.  Neurological:      Mental Status: He is alert and oriented to person, place, and time.     Motor: No weakness.     Gait: Gait normal.  Psychiatric:        Mood and Affect: Mood normal.        Behavior: Behavior normal.        Thought Content: Thought content normal.        Judgment: Judgment normal.     BP 128/67   Pulse 66   Temp (!) 97.2 F (36.2 C)   Wt 220 lb (  99.8 kg)   SpO2 97%   BMI 29.03 kg/m  Wt Readings from Last 3 Encounters:  02/06/24 220 lb (99.8 kg)  01/19/24 214 lb (97.1 kg)  11/21/23 209 lb (94.8 kg)    No results found for: "TSH" Lab Results  Component Value Date   WBC 4.2 11/21/2023   HGB 13.6 11/21/2023   HCT 41.6 11/21/2023   MCV 81 11/21/2023   PLT 195 11/21/2023   Lab Results  Component Value Date   NA 140 11/21/2023   K 4.8 11/21/2023   CO2 26 11/21/2023   GLUCOSE 88 11/21/2023   BUN 20 11/21/2023   CREATININE 1.06 11/21/2023   BILITOT 0.3 11/21/2023   ALKPHOS 77 11/21/2023   AST 19 11/21/2023   ALT 17 11/21/2023   PROT 6.7 11/21/2023   ALBUMIN 4.3 11/21/2023   CALCIUM 9.4 11/21/2023   ANIONGAP 7 03/04/2015   EGFR 78 11/21/2023   No results found for: "CHOL" No results found for: "HDL" No results found for: "LDLCALC" No results found for: "TRIG" No results found for: "CHOLHDL" Lab Results  Component Value Date   HGBA1C 6.2 12/03/2008      Assessment & Plan:   Problem List Items Addressed This Visit       Cardiovascular and Mediastinum   Primary hypertension   BP Readings from Last 3 Encounters:  02/06/24 128/67  01/19/24 122/80  11/21/23 119/71   Continue current medications. No changes in management. Discussed DASH diet and dietary sodium restrictions Continue to increase dietary efforts and exercise.         Relevant Medications   olmesartan -hydrochlorothiazide (BENICAR  HCT) 40-25 MG tablet     Musculoskeletal and Integument   Primary osteoarthritis of right knee   Wants to be referred to an orthopedics within the call  health system, referral placed Start methylprednisolone tomorrow take with food ,avoid taking ibuprofen, Aleve  while taking methylprednisolone Take Tylenol 650 mg every 6 hours as needed - methylPREDNISolone (MEDROL DOSEPAK) 4 MG TBPK tablet; Please take as instructed on the packaging.  Take with food  Dispense: 1 each; Refill: 0 - Ambulatory referral to Orthopedic Surgery - ketorolac  (TORADOL ) 30 MG/ML injection 30 mg - methylPREDNISolone sodium succinate (SOLU-MEDROL) 40 MG injection 40 mg        Relevant Medications   methylPREDNISolone (MEDROL DOSEPAK) 4 MG TBPK tablet   Other Relevant Orders   Ambulatory referral to Orthopedic Surgery   Primary osteoarthritis of left knee   Wants to be referred to an orthopedics within the call health system, referral placed Start methylprednisolone tomorrow take with food ,avoid taking ibuprofen, Aleve  while taking methylprednisolone Take Tylenol 650 mg every 6 hours as needed - methylPREDNISolone (MEDROL DOSEPAK) 4 MG TBPK tablet; Please take as instructed on the packaging.  Take with food  Dispense: 1 each; Refill: 0 - Ambulatory referral to Orthopedic Surgery - ketorolac  (TORADOL ) 30 MG/ML injection 30 mg - methylPREDNISolone sodium succinate (SOLU-MEDROL) 40 MG injection 40 mg        Relevant Medications   methylPREDNISolone (MEDROL DOSEPAK) 4 MG TBPK tablet   Other Relevant Orders   Ambulatory referral to Orthopedic Surgery     Other   Alcohol use disorder   Cessation encouraged      Lower extremity edema - Primary   Checking BNP Patient referred to vascular specialist DASH diet, elevation and use of compression socks discussed      Relevant Orders   Ambulatory referral to Vascular Surgery   Brain natriuretic peptide  Meds ordered this encounter  Medications   methylPREDNISolone (MEDROL DOSEPAK) 4 MG TBPK tablet    Sig: Please take as instructed on the packaging.  Take with food    Dispense:  1 each    Refill:  0    ketorolac  (TORADOL ) 30 MG/ML injection 30 mg   methylPREDNISolone sodium succinate (SOLU-MEDROL) 40 MG injection 40 mg   olmesartan -hydrochlorothiazide (BENICAR  HCT) 40-25 MG tablet    Sig: Take 1 tablet by mouth daily.    Dispense:  90 tablet    Refill:  1    Follow-up: No follow-ups on file.    Geordan Xu R Crew Goren, FNP

## 2024-02-06 NOTE — Assessment & Plan Note (Signed)
 BP Readings from Last 3 Encounters:  02/06/24 128/67  01/19/24 122/80  11/21/23 119/71   Continue current medications. No changes in management. Discussed DASH diet and dietary sodium restrictions Continue to increase dietary efforts and exercise.

## 2024-02-07 LAB — BRAIN NATRIURETIC PEPTIDE: BNP: 26.8 pg/mL (ref 0.0–100.0)

## 2024-02-09 ENCOUNTER — Ambulatory Visit: Payer: Self-pay | Admitting: Nurse Practitioner

## 2024-02-13 ENCOUNTER — Ambulatory Visit: Admitting: Physician Assistant

## 2024-02-13 ENCOUNTER — Other Ambulatory Visit (INDEPENDENT_AMBULATORY_CARE_PROVIDER_SITE_OTHER): Payer: Self-pay

## 2024-02-13 ENCOUNTER — Encounter: Payer: Self-pay | Admitting: Physician Assistant

## 2024-02-13 DIAGNOSIS — M25561 Pain in right knee: Secondary | ICD-10-CM

## 2024-02-13 DIAGNOSIS — M25562 Pain in left knee: Secondary | ICD-10-CM

## 2024-02-13 MED ORDER — METHYLPREDNISOLONE ACETATE 40 MG/ML IJ SUSP
40.0000 mg | INTRAMUSCULAR | Status: AC | PRN
Start: 2024-02-13 — End: 2024-02-13
  Administered 2024-02-13: 40 mg via INTRA_ARTICULAR

## 2024-02-13 MED ORDER — LIDOCAINE HCL 1 % IJ SOLN
3.0000 mL | INTRAMUSCULAR | Status: AC | PRN
Start: 2024-02-13 — End: 2024-02-13
  Administered 2024-02-13: 3 mL

## 2024-02-13 NOTE — Progress Notes (Signed)
 Office Visit Note   Patient: Donald Powers           Date of Birth: [birth year]           MRN: [id number] Visit Date: 02/13/2024              Requested by: Jacquetta Mattocks, FNP [phone] [address] [place],  [place] [address] PCP: [name], [name], FNP   Assessment & Plan: Visit Diagnoses:  1. Pain in both knees, unspecified chronicity     Plan: Patient is a pleasant 68 year old referred for bilateral knee pain.  No history of injuries.  X-rays do show some degenerative changes.  He has moderate grind bilaterally on exam but ligamentous stability.  He has had ultrasound in the past to be worked up for DVT which was negative.  He does have some swelling in the left leg but no redness no cellulitis negative Homans' sign.  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  Will go forward with injections today.  I do think he would benefit from wearing compression stockings especially while he is at work and have given him information about that.  As the ultrasound he had had for this of several months ago was negative and he has no clinical signs of a DVT with regards to pain swelling or pop or Celine Collard' sign if he does not get better perhaps he should be referred to vascular.  Have also given him close chain exercises to do for quadricep strengthening  Follow-Up Instructions: No follow-ups on file.   Orders:  No orders of the defined types were placed in this encounter.  No orders of the defined types were placed in this encounter.     Procedures: Large Joint Inj: bilateral knee on 02/13/2024 2:09 PM Indications: pain and diagnostic evaluation Details: 25 G 1.5 in needle, anteromedial approach  Arthrogram: No  Medications (Right): 3 mL lidocaine 1 %; 40 mg methylPREDNISolone  acetate 40 MG/ML Medications (Left): 3 mL lidocaine 1 %; 40 mg methylPREDNISolone  acetate 40 MG/ML Outcome: tolerated well, no immediate complications Procedure, treatment alternatives,  risks and benefits explained, specific risks discussed. Consent was given by the patient.     Clinical Data: No additional findings.   Subjective: No chief complaint on file.   HPI patient is a pleasant 68 year old gentleman comes down with bilateral knee pain.  He gets pain that goes down to his feet been going on 1 month or more.  Is no medication no injuries he is wondering if an injection might be helpful he is not diabetic denies any fever or chills  Review of Systems  All other systems reviewed and are negative.    Objective: Vital Signs: There were no vitals taken for this visit.  Physical Exam Constitutional:      Appearance: Normal appearance.  Pulmonary:     Effort: Pulmonary effort is normal.  Skin:    General: Skin is warm and dry.  Neurological:     Mental Status: He is alert.  Psychiatric:        Mood and Affect: Mood normal.        Behavior: Behavior normal.   Ortho Exam Bilateral knees he has some generalized lower extremity swelling on the left more than the right but no redness no cellulitis compartments are soft and compressible he has good active range of motion negative Homans' sign good stability with range of motion of the knee bilaterally he has significant grinding  Specialty Comments:  No specialty comments available.  Imaging: No results found.   PMFS History: Patient Active Problem List   Diagnosis Date Noted  . Bilateral knee pain 02/13/2024  . Primary osteoarthritis of right knee 02/06/2024  . Primary osteoarthritis of left knee 02/06/2024  . Lower extremity edema 02/06/2024  . Alcohol use disorder 11/21/2023  . Primary hypertension 11/21/2023  . Dizziness 11/21/2023  . Alcoholic liver disease (HCC) 11/21/2023  . Chronic abdominal pain 11/21/2023   Past Medical History:  Diagnosis Date  . Alcoholic liver disease (HCC)   . Hypertension     Family History  Problem Relation Age of Onset  . Diabetes Mother   . Heart disease  Mother   . Liver disease Neg Hx   . Colon cancer Neg Hx   . Esophageal cancer Neg Hx     History reviewed. No pertinent surgical history. Social History   Occupational History  . Not on file  Tobacco Use  . Smoking status: Never  . Smokeless tobacco: Not on file  Vaping Use  . Vaping status: Never Used  Substance and Sexual Activity  . Alcohol use: Yes    Comment: beer and liquor, socially  . Drug use: No  . Sexual activity: Not on file

## 2024-02-23 ENCOUNTER — Telehealth: Payer: Self-pay

## 2024-02-23 NOTE — Telephone Encounter (Signed)
 Copied from CRM [id number]. Topic: Appointments - Appointment Scheduling >> Feb 23, 2024  1:27 PM Donald Powers E wrote: Patient/patient representative is calling to schedule an appointment. Refer to attachments for appointment information.    Needs referrals, wants to be seen sooner.  Called pt back for more info concerning referral. Lvm for call back. KH

## 2024-02-25 ENCOUNTER — Telehealth: Payer: Self-pay

## 2024-02-25 NOTE — Telephone Encounter (Signed)
 There is currently no referral for podiatry or dentistry.  Please refer to patient's PCP for referrals. Thank you

## 2024-02-25 NOTE — Telephone Encounter (Signed)
 Copied from CRM [id number]. Topic: Referral - Status >> Feb 25, 2024 11:34 AM Sophia H wrote: Reason for CRM: Patient is calling in today regarding a referral to a foot doctor / dentist that [facility] health is associated with. States he missed the call and was just wanting to know if these can be put in on his behalf. Please advise, [phone]  Please advise of any updates. Thank you. KH

## 2024-03-01 ENCOUNTER — Other Ambulatory Visit: Payer: Self-pay

## 2024-03-01 DIAGNOSIS — R600 Localized edema: Secondary | ICD-10-CM

## 2024-03-05 ENCOUNTER — Telehealth: Payer: Self-pay | Admitting: Nurse Practitioner

## 2024-03-05 NOTE — Telephone Encounter (Signed)
 Copied from CRM [id number]. Topic: Referral - Question >> Mar 05, 2024 11:09 AM Willma SAUNDERS wrote: Reason for CRM: See telephone encounter from 02/25/24.  Patient is looking for a podiatry referral due to pain in his feet and struggling with finding the right shoes. He is looking for a referral to a dentist for a major cleaning and potential cavities. Has [employer] would like someone within his network.  Patient can be reached at [phone]

## 2024-03-08 ENCOUNTER — Other Ambulatory Visit: Payer: Self-pay | Admitting: Nurse Practitioner

## 2024-03-08 DIAGNOSIS — G8929 Other chronic pain: Secondary | ICD-10-CM

## 2024-03-08 DIAGNOSIS — K029 Dental caries, unspecified: Secondary | ICD-10-CM

## 2024-03-10 ENCOUNTER — Telehealth: Payer: Self-pay | Admitting: Nurse Practitioner

## 2024-03-10 NOTE — Telephone Encounter (Signed)
 Copied from CRM [id number]. Topic: Referral - Question >> Mar 05, 2024 11:09 AM Willma SAUNDERS wrote: Reason for CRM: See telephone encounter from 02/25/24.  Patient is looking for a podiatry referral due to pain in his feet and struggling with finding the right shoes. He is looking for a referral to a dentist for a major cleaning and potential cavities. Has [employer] would like someone within his network.  Patient can be reached at [phone]

## 2024-03-10 NOTE — Telephone Encounter (Signed)
 Pt was given dental office number and will discuss foot pain at next office apt. KH

## 2024-03-15 ENCOUNTER — Ambulatory Visit (HOSPITAL_COMMUNITY)

## 2024-03-17 ENCOUNTER — Other Ambulatory Visit: Payer: Self-pay

## 2024-03-17 ENCOUNTER — Ambulatory Visit
Admission: RE | Admit: 2024-03-17 | Discharge: 2024-03-17 | Disposition: A | Discharge status: Home / Self Care | Source: Ambulatory Visit | Attending: Family Medicine | Admitting: Family Medicine

## 2024-03-17 ENCOUNTER — Emergency Department (HOSPITAL_BASED_OUTPATIENT_CLINIC_OR_DEPARTMENT_OTHER)
Admission: EM | Admit: 2024-03-17 | Discharge: 2024-03-17 | Disposition: A | Discharge status: Home / Self Care | Source: Ambulatory Visit | Attending: Emergency Medicine | Admitting: Emergency Medicine

## 2024-03-17 ENCOUNTER — Ambulatory Visit: Payer: Self-pay

## 2024-03-17 ENCOUNTER — Emergency Department (HOSPITAL_BASED_OUTPATIENT_CLINIC_OR_DEPARTMENT_OTHER)

## 2024-03-17 ENCOUNTER — Encounter (HOSPITAL_BASED_OUTPATIENT_CLINIC_OR_DEPARTMENT_OTHER): Payer: Self-pay | Admitting: Emergency Medicine

## 2024-03-17 VITALS — BP 148/94 | HR 61 | Temp 97.8°F | Resp 20

## 2024-03-17 DIAGNOSIS — I10 Essential (primary) hypertension: Secondary | ICD-10-CM | POA: Diagnosis not present

## 2024-03-17 DIAGNOSIS — R1032 Left lower quadrant pain: Secondary | ICD-10-CM

## 2024-03-17 DIAGNOSIS — Z79899 Other long term (current) drug therapy: Secondary | ICD-10-CM | POA: Insufficient documentation

## 2024-03-17 DIAGNOSIS — R1031 Right lower quadrant pain: Secondary | ICD-10-CM | POA: Insufficient documentation

## 2024-03-17 DIAGNOSIS — R1030 Lower abdominal pain, unspecified: Secondary | ICD-10-CM

## 2024-03-17 LAB — COMPREHENSIVE METABOLIC PANEL WITH GFR
ALT: 15 U/L (ref 0–44)
AST: 21 U/L (ref 15–41)
Albumin: 4 g/dL (ref 3.5–5.0)
Alkaline Phosphatase: 70 U/L (ref 38–126)
Anion gap: 9 (ref 5–15)
BUN: 16 mg/dL (ref 8–23)
CO2: 27 mmol/L (ref 22–32)
Calcium: 8.9 mg/dL (ref 8.9–10.3)
Chloride: 102 mmol/L (ref 98–111)
Creatinine, Ser: 1.01 mg/dL (ref 0.61–1.24)
GFR, Estimated: >60 mL/min (ref >60)
Glucose, Bld: 97 mg/dL (ref 70–99)
Potassium: 5 mmol/L (ref 3.5–5.1)
Sodium: 138 mmol/L (ref 135–145)
Total Bilirubin: 0.4 mg/dL (ref 0.0–1.2)
Total Protein: 6 g/dL — ABNORMAL LOW (ref 6.5–8.1)

## 2024-03-17 LAB — CBC WITH DIFFERENTIAL/PLATELET
Abs Immature Granulocytes: 0.01 K/uL (ref 0.00–0.07)
Basophils Absolute: 0 K/uL (ref 0.0–0.1)
Basophils Relative: 1 %
Eosinophils Absolute: 0.1 K/uL (ref 0.0–0.5)
Eosinophils Relative: 3 %
HCT: 37 % — ABNORMAL LOW (ref 39.0–52.0)
Hemoglobin: 12.4 g/dL — ABNORMAL LOW (ref 13.0–17.0)
Immature Granulocytes: 0 %
Lymphocytes Relative: 48 %
Lymphs Abs: 2 K/uL (ref 0.7–4.0)
MCH: 26.6 pg (ref 26.0–34.0)
MCHC: 33.5 g/dL (ref 30.0–36.0)
MCV: 79.4 fL — ABNORMAL LOW (ref 80.0–100.0)
Monocytes Absolute: 0.5 K/uL (ref 0.1–1.0)
Monocytes Relative: 12 %
Neutro Abs: 1.4 K/uL — ABNORMAL LOW (ref 1.7–7.7)
Neutrophils Relative %: 36 %
Platelets: 155 K/uL (ref 150–400)
RBC: 4.66 MIL/uL (ref 4.22–5.81)
RDW: 14.6 % (ref 11.5–15.5)
WBC: 4.1 K/uL (ref 4.0–10.5)
nRBC: 0 % (ref 0.0–0.2)

## 2024-03-17 LAB — LIPASE, BLOOD: Lipase: 28 U/L (ref 11–51)

## 2024-03-17 LAB — URINALYSIS, ROUTINE W REFLEX MICROSCOPIC
Bilirubin Urine: NEGATIVE
Glucose, UA: NEGATIVE mg/dL
Hgb urine dipstick: NEGATIVE
Ketones, ur: NEGATIVE mg/dL
Leukocytes,Ua: NEGATIVE
Nitrite: NEGATIVE
Protein, ur: NEGATIVE mg/dL
Specific Gravity, Urine: 1.01 (ref 1.005–1.030)
pH: 6.5 (ref 5.0–8.0)

## 2024-03-17 LAB — OCCULT BLOOD X 1 CARD TO LAB, STOOL: Fecal Occult Bld: NEGATIVE

## 2024-03-17 LAB — AMMONIA: Ammonia: 18 umol/L (ref 9–35)

## 2024-03-17 MED ORDER — IOHEXOL 300 MG/ML  SOLN
100.0000 mL | Freq: Once | INTRAMUSCULAR | Status: AC | PRN
  Administered 2024-03-17: 100 mL via INTRAVENOUS

## 2024-03-17 MED ORDER — DICYCLOMINE HCL 10 MG/ML IM SOLN
20.0000 mg | Freq: Once | INTRAMUSCULAR | Status: AC
  Administered 2024-03-17: 20 mg via INTRAMUSCULAR
  Filled 2024-03-17: qty 2

## 2024-03-17 MED ORDER — DICYCLOMINE HCL 20 MG PO TABS: 20.0000 mg | ORAL_TABLET | Freq: Three times a day (TID) | ORAL | 0 refills | Status: AC | PRN

## 2024-03-17 MED ORDER — SODIUM CHLORIDE 0.9 % IV BOLUS
500.0000 mL | Freq: Once | INTRAVENOUS | Status: AC
  Administered 2024-03-17: 500 mL via INTRAVENOUS

## 2024-03-17 NOTE — Telephone Encounter (Signed)
 FYI Only or Action Required?: FYI only for provider.  Patient was last seen in primary care on 02/06/2024 by [name], [name], FNP.  Called Nurse Triage reporting Abdominal Pain.  Symptoms began several days ago.  Interventions attempted: OTC medications: ibuprofen.  Symptoms are: LLQ abdominal pain gradually worsening.  Triage Disposition: See HCP Within 4 Hours (Or PCP Triage) (overriding Go to ED Now (Notify PCP))- Patient agreeable to go to urgent care now.  Patient/caregiver understands and will follow disposition?: Yes             Copied from CRM [id number]. Topic: Clinical - Red Word Triage >> Mar 17, 2024  8:04 AM Larissa RAMAN wrote: [facility] that prompted transfer to Nurse Triage: stomach pain Reason for Disposition  [1] SEVERE pain (e.g., excruciating) AND [2] present > 1 hour  Answer Assessment - Initial Assessment Questions Patient aware no available appointments at PCP office today. Advised urgent care or ER to be seen today. Patient declined mobile medicine clinic and would prefer to go to urgent care. Patient going to urgent care now.  1. LOCATION: Where does it hurt?      Lower left abdomen.  2. RADIATION: Does the pain shoot anywhere else? (e.g., chest, back)     To some degree, yes, it goes to the bottom of my stomach  3. ONSET: When did the pain begin? (Minutes, hours or days ago)      "Sunday.  4. SUDDEN: Gradual or sudden onset?     Gradual.  5. PATTERN Does the pain come and go, or is it constant?    - If it comes and goes: How long does it last? Do you have pain now?     (Note: Comes and goes means the pain is intermittent. It goes away completely between bouts.)    - If constant: Is it getting better, staying the same, or getting worse?      (Note: Constant means the pain never goes away completely; most serious pain is constant and gets worse.)      Constant lately, getting worse.  6. SEVERITY: How bad is the pain?   (e.g., Scale 1-10; mild, moderate, or severe)    - MILD (1-3): Doesn't interfere with normal activities, abdomen soft and not tender to touch.     - MODERATE (4-7): Interferes with normal activities or awakens from sleep, abdomen tender to touch.     - SEVERE (8-10): Excruciating pain, doubled over, unable to do any normal activities.       10" /10. Tight pain.  7. RECURRENT SYMPTOM: Have you ever had this type of stomach pain before? If Yes, ask: When was the last time? and What happened that time?      Yes, about a year ago. He states last time it was a stomach virus.  8. CAUSE: What do you think is causing the stomach pain?     Patient states he has had diverticulitis issues found on  a colonoscopy before.  9. RELIEVING/AGGRAVATING FACTORS: What makes it better or worse? (e.g., antacids, bending or twisting motion, bowel movement)     Drinking coffee (caffeine) sometimes it helps and sometimes it makes it worse.  10. OTHER SYMPTOMS: Do you have any other symptoms? (e.g., back pain, diarrhea, fever, urination pain, vomiting)       Patient denies: nausea, vomiting, fever, diarrhea, constipation, blood in stool.  Protocols used: Abdominal Pain - Male-A-AH

## 2024-03-17 NOTE — ED Notes (Signed)
 Patient is being discharged from the Urgent Care and sent to the Emergency Department via POV . Per [place], [place] C, patient is in need of higher level of care due to abd pain. Patient is aware and verbalizes understanding of plan of care.  Vitals:   03/17/24 0905  BP: (!) 148/94  Pulse: 61  Resp: 20  Temp: 97.8 F (36.6 C)  SpO2: 96%

## 2024-03-17 NOTE — ED Provider Notes (Signed)
 [facility]  Note:  This document was prepared using Conservation officer, historic buildings and may include unintentional dictation errors.  MRN: [id number] DOB: Apr 05, 1956  Subjective:   Donald Powers is a 68 y.o. male presenting for 3-day history of acute onset persistent severe abdominal pain over the lower abdomen with associated nausea without vomiting.  Denies fever, chest pain, shortness of breath, diarrhea, constipation, bloody stool.  Patient reached out to his PCP who recommended an ER evaluation.  However, patient decided to come to our urgent care instead.  Has a history of alcoholic liver disease, fatty liver.  Currently has an occasional drinker.  No current facility-administered medications for this encounter.  Current Outpatient Medications:    celecoxib (CELEBREX) 200 MG capsule, Take 200 mg by mouth 2 (two) times daily as needed. (Patient not taking: Reported on 02/06/2024), Disp: , Rfl:    methylPREDNISolone  (MEDROL  DOSEPAK) 4 MG TBPK tablet, Please take as instructed on the packaging.  Take with food, Disp: 1 each, Rfl: 0   naltrexone  (DEPADE) 50 MG tablet, Take 1 tablet (50 mg total) by mouth daily. (Patient not taking: Reported on 02/06/2024), Disp: 30 tablet, Rfl: 1   olmesartan -hydrochlorothiazide (BENICAR  HCT) 40-25 MG tablet, Take 1 tablet by mouth daily., Disp: 90 tablet, Rfl: 1   polyethylene glycol powder (MIRALAX) 17 GM/SCOOP powder, , Disp: , Rfl:    Pramoxine-HC (HYDROCORTISONE ACE-PRAMOXINE) 2.5-1 % CREA, , Disp: , Rfl:    sildenafil (REVATIO) 20 MG tablet, , Disp: , Rfl:    No Known Allergies  Past Medical History:  Diagnosis Date   Alcoholic liver disease (HCC)    Hypertension      History reviewed. No pertinent surgical history.  Family History  Problem Relation Age of Onset   Diabetes Mother    Heart disease Mother    Liver disease Neg Hx    Colon cancer Neg Hx    Esophageal cancer Neg Hx     Social History   Tobacco  Use   Smoking status: Never   Smokeless tobacco: Never  Vaping Use   Vaping status: Never Used  Substance Use Topics   Alcohol use: Yes    Comment: occ   Drug use: No    ROS   Objective:   Vitals: BP (!) 148/94 (BP Location: Right Arm)   Pulse 61   Temp 97.8 F (36.6 C) (Oral)   Resp 20   SpO2 96%   Physical Exam Constitutional:      General: He is not in acute distress.    Appearance: Normal appearance. He is well-developed and normal weight. He is not ill-appearing, toxic-appearing or diaphoretic.  HENT:     Head: Normocephalic and atraumatic.     Right Ear: External ear normal.     Left Ear: External ear normal.     Nose: Nose normal.     Mouth/Throat:     Mouth: Mucous membranes are moist.     Pharynx: Oropharynx is clear.  Eyes:     General: No scleral icterus.       Right eye: No discharge.        Left eye: No discharge.     Extraocular Movements: Extraocular movements intact.  Cardiovascular:     Rate and Rhythm: Normal rate and regular rhythm.     Heart sounds: Normal heart sounds. No murmur heard.    No friction rub. No gallop.  Pulmonary:     Effort: Pulmonary effort is normal. No  respiratory distress.     Breath sounds: Normal breath sounds. No stridor. No wheezing, rhonchi or rales.  Abdominal:     General: Bowel sounds are normal. There is distension.     Palpations: Abdomen is soft. There is no mass.     Tenderness: There is abdominal tenderness in the periumbilical area and left lower quadrant. There is no right CVA tenderness, left CVA tenderness, guarding or rebound.  Musculoskeletal:     Cervical back: Normal range of motion.  Neurological:     Mental Status: He is alert and oriented to person, place, and time.  Psychiatric:        Mood and Affect: Mood normal.        Behavior: Behavior normal.        Thought Content: Thought content normal.        Judgment: Judgment normal.     Assessment and Plan :   PDMP not reviewed this  encounter.  1. Abdominal pain, left lower quadrant    In agreement with his PCP, I recommended higher level of testing and care than we can provide in the urgent care setting.  This includes consideration for point-of-care labs, CT scan to rule out an acute abdomen, acute diverticulitis among other acute intra-abdominal emergencies.  Patient verbalizes understanding and will present to the emergency room now.   Christopher Savannah, [place] 03/17/24 [phone]

## 2024-03-17 NOTE — Discharge Instructions (Addendum)
 I am concerned that you are in need of a higher level of testing and care than we can provide in the urgent care setting. This includes consideration for a CT scan to rule out an acute abdomen, acute diverticulitis. Please go there now.

## 2024-03-17 NOTE — ED Provider Notes (Signed)
 [place] [facility] Provider Note   CSN: [id number] Arrival date & time: 03/17/24  [id number]     Patient presents with: Abdominal Pain   Donald Powers is a 68 y.o. male.   Pt is a 68 yo male with pmhx significant for HTN, alcohol abuse, and alcohol liver disease.  Pt has been having lower abd pain for the past few days.  No n/v.  No f/c.  No constipation or diarrhea.  Pt initially went to UC and was sent here for further eval.  Pt said he did drink more than usual this past weekend because of the holiday.       Prior to Admission medications   Medication Sig Start Date End Date Taking? Authorizing Provider  dicyclomine  (BENTYL ) 20 MG tablet Take 1 tablet (20 mg total) by mouth 3 (three) times daily as needed (abd spasms). 03/17/24  Yes Dean Clarity, MD  celecoxib (CELEBREX) 200 MG capsule Take 200 mg by mouth 2 (two) times daily as needed. Patient not taking: Reported on 02/06/2024 06/10/23   [provider]  methylPREDNISolone  (MEDROL  DOSEPAK) 4 MG TBPK tablet Please take as instructed on the packaging.  Take with food 02/06/24   [name], [name], FNP  naltrexone  (DEPADE) 50 MG tablet Take 1 tablet (50 mg total) by mouth daily. Patient not taking: Reported on 02/06/2024 11/24/23   [name], [name], FNP  olmesartan -hydrochlorothiazide (BENICAR  HCT) 40-25 MG tablet Take 1 tablet by mouth daily. 02/06/24   [name], [name], FNP  polyethylene glycol powder (MIRALAX) 17 GM/SCOOP powder     [provider]  Pramoxine-HC (HYDROCORTISONE ACE-PRAMOXINE) 2.5-1 % CREA  01/17/21   [provider]  sildenafil (REVATIO) 20 MG tablet  12/04/20   [provider]    Allergies: Patient has no known allergies.    Review of Systems  Gastrointestinal:  Positive for abdominal pain.  All other systems reviewed and are negative.   Updated Vital Signs BP (!) 151/79   Pulse 60   Temp 97.8 F (36.6 C) (Oral)   Resp 16   Wt  112.5 kg   SpO2 100%   BMI 32.72 kg/m   Physical Exam Vitals and nursing note reviewed. Exam conducted with a chaperone present.  Constitutional:      Appearance: He is well-developed.  HENT:     Head: Normocephalic and atraumatic.     Mouth/Throat:     Mouth: Mucous membranes are moist.  Cardiovascular:     Rate and Rhythm: Normal rate and regular rhythm.  Abdominal:     General: Abdomen is protuberant. Bowel sounds are normal.     Palpations: Abdomen is soft.     Tenderness: There is abdominal tenderness in the right lower quadrant and left lower quadrant.  Genitourinary:    Rectum: Guaiac result negative.     Comments: Normal color stool (brown) Skin:    General: Skin is warm.     Capillary Refill: Capillary refill takes less than 2 seconds.  Neurological:     General: No focal deficit present.     Mental Status: He is alert and oriented to person, place, and time.  Psychiatric:        Mood and Affect: Mood normal.        Behavior: Behavior normal.     (all labs ordered are listed, but only abnormal results are displayed) Labs Reviewed  CBC WITH DIFFERENTIAL/PLATELET - Abnormal; Notable for the following components:  Result Value   Hemoglobin 12.4 (*)    HCT 37.0 (*)    MCV 79.4 (*)    Neutro Abs 1.4 (*)    All other components within normal limits  COMPREHENSIVE METABOLIC PANEL WITH GFR - Abnormal; Notable for the following components:   Total Protein 6.0 (*)    All other components within normal limits  LIPASE, BLOOD  URINALYSIS, ROUTINE W REFLEX MICROSCOPIC  AMMONIA  OCCULT BLOOD X 1 CARD TO LAB, STOOL    EKG: EKG Interpretation Date/Time:  Wednesday March 17 2024 10:17:24 EDT Ventricular Rate:  66 PR Interval:  143 QRS Duration:  94 QT Interval:  389 QTC Calculation: 408 R Axis:   31  Text Interpretation: Sinus rhythm Inferolateral infarct, old No old tracing to compare Confirmed by Dean Clarity [phone]) on 03/17/2024 10:38:20  AM  Radiology: CT ABDOMEN PELVIS W CONTRAST Result Date: 03/17/2024 CLINICAL DATA:  Abdominal pain, acute, nonlocalized EXAM: CT ABDOMEN AND PELVIS WITH CONTRAST TECHNIQUE: Multidetector CT imaging of the abdomen and pelvis was performed using the standard protocol following bolus administration of intravenous contrast. RADIATION DOSE REDUCTION: This exam was performed according to the departmental dose-optimization program which includes automated exposure control, adjustment of the mA and/or kV according to patient size and/or use of iterative reconstruction technique. CONTRAST:  OMNIPAQUE  IOHEXOL  300 MG/ML  SOLN COMPARISON:  March 09, 2015 FINDINGS: Lower chest: No focal airspace consolidation or pleural effusion. Hepatobiliary: No mass.No radiopaque stones or wall thickening of the gallbladder. No intrahepatic or extrahepatic biliary ductal dilation. The portal veins are patent. Pancreas: No mass or main ductal dilation. No peripancreatic inflammation or fluid collection. Spleen: Normal size. No mass. Adrenals/Urinary Tract: No adrenal masses. Multiple bilateral renal cysts measuring up to 7.5 cm in the left lower pole. No nephrolithiasis or hydronephrosis. Partially distended urinary bladder without visualized abnormality. Stomach/Bowel: The stomach is decompressed without focal abnormality. No small bowel wall thickening or inflammation. No small bowel obstruction.Normal appendix. Total colonic diverticulosis. No changes of acute diverticulitis. Vascular/Lymphatic: No aortic aneurysm. Scattered aortoiliac atherosclerosis. No intraabdominal or pelvic lymphadenopathy. Reproductive: Mild prostatomegaly.No free pelvic fluid. Other: No pneumoperitoneum, ascites, or mesenteric inflammation. Musculoskeletal: No acute fracture or destructive lesion. Multilevel degenerative disc disease of the spine. IMPRESSION: 1. No acute intra-abdominal or pelvic abnormality. 2. Total colonic diverticulosis. No changes of  acute diverticulitis. 3. Mild prostatomegaly. Aortic Atherosclerosis (ICD10-I70.0). Electronically Signed   By: Rogelia Myers M.D.   On: 03/17/2024 12:13     Procedures   Medications Ordered in the ED  sodium chloride  0.9 % bolus 500 mL (0 mLs Intravenous Stopped 03/17/24 1202)  iohexol  (OMNIPAQUE ) 300 MG/ML solution 100 mL (100 mLs Intravenous Contrast Given 03/17/24 1125)  dicyclomine  (BENTYL ) injection 20 mg (20 mg Intramuscular Given 03/17/24 1231)                                    Medical Decision Making Amount and/or Complexity of Data Reviewed Labs: ordered. Radiology: ordered.  Risk Prescription drug management.   This patient presents to the ED for concern of abd pain, this involves an extensive number of treatment options, and is a complaint that carries with it a high risk of complications and morbidity.  The differential diagnosis includes appendicitis, diverticulitis, colitis, uti   Co morbidities that complicate the patient evaluation  HTN, alcohol abuse, and alcohol liver disease   Additional history obtained:  Additional history obtained  from epic chart review  Lab Tests:  I Ordered, and personally interpreted labs.  The pertinent results include:  cbc with hgb sl low at 12.4 (13.6 in March); cmp nl; lip nl; ammonia neg; ua neg   Imaging Studies ordered:  I ordered imaging studies including ct abd pelvis  I independently visualized and interpreted imaging which showed  No acute intra-abdominal or pelvic abnormality.  2. Total colonic diverticulosis. No changes of acute diverticulitis.  3. Mild prostatomegaly.    Aortic Atherosclerosis (ICD10-I70.0).   I agree with the radiologist interpretation   Cardiac Monitoring:  The patient was maintained on a cardiac monitor.  I personally viewed and interpreted the cardiac monitored which showed an underlying rhythm of: nsr   Medicines ordered and prescription drug management:  I ordered medication  including bentyl   for sx  Reevaluation of the patient after these medicines showed that the patient improved I have reviewed the patients home medicines and have made adjustments as needed   Test Considered:  ct   Problem List / ED Course:  Abd pain:  likely due to alcohol as sx started after drinking more than usual.  Labs with mild anemia.  Guaiac is neg.  I will put pt on bentyl  to see if that will help.  He is stable for d/c.  Return if worse. F/u with pcp/gi   Reevaluation:  After the interventions noted above, I reevaluated the patient and found that they have :improved   Social Determinants of Health:  Lives at home   Dispostion:  After consideration of the diagnostic results and the patients response to treatment, I feel that the patent would benefit from discharge with outpatient f/u.       Final diagnoses:  Lower abdominal pain    ED Discharge Orders          Ordered    dicyclomine  (BENTYL ) 20 MG tablet  3 times daily PRN        03/17/24 1237               Dean Clarity, MD 03/17/24 1237

## 2024-03-17 NOTE — Discharge Instructions (Addendum)
Try to avoid alcohol

## 2024-03-17 NOTE — ED Triage Notes (Addendum)
 Pt c/o generalized abd pain started 7/6-reports some nausea/denies vomiting and diarrhea-no relief with ibuprofen/last dose last night-NAD-steady gait

## 2024-03-17 NOTE — ED Triage Notes (Signed)
 Sharp lower to all over abdomen , denies NV . Was seen at [facility] , sent here for further evaluation .

## 2024-03-22 ENCOUNTER — Encounter: Payer: Self-pay | Admitting: Nurse Practitioner

## 2024-03-22 ENCOUNTER — Ambulatory Visit: Payer: Self-pay | Admitting: Nurse Practitioner

## 2024-03-22 VITALS — BP 118/68 | HR 65 | Wt 213.0 lb

## 2024-03-22 DIAGNOSIS — F1090 Alcohol use, unspecified, uncomplicated: Secondary | ICD-10-CM

## 2024-03-22 DIAGNOSIS — D649 Anemia, unspecified: Secondary | ICD-10-CM | POA: Diagnosis not present

## 2024-03-22 DIAGNOSIS — R109 Unspecified abdominal pain: Secondary | ICD-10-CM

## 2024-03-22 DIAGNOSIS — I10 Essential (primary) hypertension: Secondary | ICD-10-CM | POA: Diagnosis not present

## 2024-03-22 DIAGNOSIS — G8929 Other chronic pain: Secondary | ICD-10-CM

## 2024-03-22 DIAGNOSIS — R42 Dizziness and giddiness: Secondary | ICD-10-CM

## 2024-03-22 DIAGNOSIS — Z09 Encounter for follow-up examination after completed treatment for conditions other than malignant neoplasm: Secondary | ICD-10-CM | POA: Insufficient documentation

## 2024-03-22 DIAGNOSIS — K5900 Constipation, unspecified: Secondary | ICD-10-CM | POA: Insufficient documentation

## 2024-03-22 DIAGNOSIS — R1030 Lower abdominal pain, unspecified: Secondary | ICD-10-CM | POA: Insufficient documentation

## 2024-03-22 NOTE — Assessment & Plan Note (Signed)
 Continue MiraLAX as needed and Metamucil daily Recommended 64 ounces of water daily to maintain hydration

## 2024-03-22 NOTE — Assessment & Plan Note (Signed)
 Well-controlled on olmesartan -hydrochlorothiazide 40-25 mg 1 tablet daily Continue current medication DASH diet and commitment to daily physical activity for a minimum of 30 minutes discussed and encouraged, as a part of hypertension management. The importance of attaining a healthy weight is also discussed.     03/22/2024    1:08 PM 03/17/2024   11:15 AM 03/17/2024    9:42 AM 03/17/2024    9:39 AM 03/17/2024    9:05 AM 02/06/2024   10:45 AM 02/06/2024   10:37 AM  BP/Weight  Systolic BP 118 137 151  148 128 133  Diastolic BP 68 85 79  94 67 86  Wt. (Lbs) 213   248   220  BMI 28.1 kg/m2   32.72 kg/m2   29.03 kg/m2

## 2024-03-22 NOTE — Assessment & Plan Note (Signed)
 Hospital chart reviewed, including discharge summary Medications reconciled and reviewed with the patient in detail

## 2024-03-22 NOTE — Assessment & Plan Note (Signed)
 Lab Results  Component Value Date   WBC 4.1 03/17/2024   HGB 12.4 (L) 03/17/2024   HCT 37.0 (L) 03/17/2024   MCV 79.4 (L) 03/17/2024   PLT 155 03/17/2024  Patient denies abnormal bleeding We will recheck CBC in 1 week

## 2024-03-22 NOTE — Patient Instructions (Signed)

## 2024-03-22 NOTE — Progress Notes (Addendum)
 Established Patient Office Visit  Subjective:  Patient ID: Donald Powers, male    DOB: 30-Aug-1956  Age: 68 y.o. MRN: [id number]  CC:  Chief Complaint  Patient presents with   Consult    HPI Donald Powers is a 68 y.o. male  has a past medical history of Alcoholic liver disease (HCC) and Hypertension.  Patient presents for follow-up for his chronic medical conditions  Chronic abdominal pain.  Patient was at the emergency department on 03/17/2024 for complaints of lower abdominal pain.  CT abdomen showed total colonic diverticulosis no changes of acute diverticulitis noted. abdominal pain was thought to be likely due to alcohol.  He was prescribed dicyclomine  20 mg 3 times daily as needed for abdominal spasm which he has been taking.  Stated that he feels much better, currently denies pain.  Takes Metamucil daily and MiraLAX as needed constipation.   Alcohol use disorder.  Drinks whiskey on weekends.  Currently denies nausea, vomiting.      Past Medical History:  Diagnosis Date   Alcoholic liver disease (HCC)    Hypertension     History reviewed. No pertinent surgical history.  Family History  Problem Relation Age of Onset   Diabetes Mother    Heart disease Mother    Liver disease Neg Hx    Colon cancer Neg Hx    Esophageal cancer Neg Hx     Social History   Socioeconomic History   Marital status: Divorced    Spouse name: Not on file   Number of children: 2   Years of education: Not on file   Highest education level: Not on file  Occupational History   Not on file  Tobacco Use   Smoking status: Never   Smokeless tobacco: Never  Vaping Use   Vaping status: Never Used  Substance and Sexual Activity   Alcohol use: Yes    Comment: occ   Drug use: No   Sexual activity: Not on file  Other Topics Concern   Not on file  Social History Narrative   Lives with his brother    Social Drivers of Corporate investment banker Strain: Not on file  Food Insecurity: Not  on file  Transportation Needs: Not on file  Physical Activity: Not on file  Stress: Not on file  Social Connections: Not on file  Intimate Partner Violence: Not on file    Outpatient Medications Prior to Visit  Medication Sig Dispense Refill   dicyclomine  (BENTYL ) 20 MG tablet Take 1 tablet (20 mg total) by mouth 3 (three) times daily as needed (abd spasms). 20 tablet 0   olmesartan -hydrochlorothiazide (BENICAR  HCT) 40-25 MG tablet Take 1 tablet by mouth daily. 90 tablet 1   polyethylene glycol powder (MIRALAX) 17 GM/SCOOP powder      celecoxib (CELEBREX) 200 MG capsule Take 200 mg by mouth 2 (two) times daily as needed. (Patient not taking: Reported on 03/22/2024)     sildenafil (REVATIO) 20 MG tablet  (Patient not taking: Reported on 03/22/2024)     methylPREDNISolone  (MEDROL  DOSEPAK) 4 MG TBPK tablet Please take as instructed on the packaging.  Take with food (Patient not taking: Reported on 03/22/2024) 1 each 0   naltrexone  (DEPADE) 50 MG tablet Take 1 tablet (50 mg total) by mouth daily. (Patient not taking: Reported on 03/22/2024) 30 tablet 1   Pramoxine-HC (HYDROCORTISONE ACE-PRAMOXINE) 2.5-1 % CREA  (Patient not taking: Reported on 03/22/2024)     No facility-administered medications prior to visit.  No Known Allergies  ROS Review of Systems  Constitutional:  Negative for appetite change, chills, fatigue and fever.  HENT:  Negative for congestion, postnasal drip, rhinorrhea and sneezing.   Respiratory:  Negative for cough, shortness of breath and wheezing.   Cardiovascular:  Negative for chest pain, palpitations and leg swelling.  Gastrointestinal:  Positive for abdominal pain and constipation. Negative for nausea and vomiting.  Genitourinary:  Negative for difficulty urinating, dysuria, flank pain and frequency.  Musculoskeletal:  Negative for arthralgias, back pain, joint swelling and myalgias.  Skin:  Negative for color change, pallor, rash and wound.  Neurological:   Positive for dizziness. Negative for facial asymmetry, weakness, numbness and headaches.  Psychiatric/Behavioral:  Negative for behavioral problems, confusion, self-injury and suicidal ideas.       Objective:    Physical Exam Vitals and nursing note reviewed.  Constitutional:      General: He is not in acute distress.    Appearance: Normal appearance. He is not ill-appearing, toxic-appearing or diaphoretic.  Eyes:     General: No scleral icterus.       Right eye: No discharge.        Left eye: No discharge.     Extraocular Movements: Extraocular movements intact.     Conjunctiva/sclera: Conjunctivae normal.  Cardiovascular:     Rate and Rhythm: Normal rate and regular rhythm.     Pulses: Normal pulses.     Heart sounds: Normal heart sounds. No murmur heard.    No friction rub. No gallop.  Pulmonary:     Effort: Pulmonary effort is normal. No respiratory distress.     Breath sounds: Normal breath sounds. No stridor. No wheezing, rhonchi or rales.  Chest:     Chest wall: No tenderness.  Abdominal:     General: There is no distension.     Palpations: Abdomen is soft.     Tenderness: There is no abdominal tenderness. There is no right CVA tenderness, left CVA tenderness or guarding.  Musculoskeletal:        General: No swelling, tenderness, deformity or signs of injury.     Right lower leg: No edema.     Left lower leg: No edema.  Skin:    General: Skin is warm and dry.     Capillary Refill: Capillary refill takes less than 2 seconds.     Coloration: Skin is not jaundiced or pale.     Findings: No bruising, erythema or lesion.  Neurological:     Mental Status: He is alert and oriented to person, place, and time.     Motor: No weakness.     Coordination: Coordination normal.     Gait: Gait normal.  Psychiatric:        Mood and Affect: Mood normal.        Behavior: Behavior normal.        Thought Content: Thought content normal.        Judgment: Judgment normal.     BP  118/68   Pulse 65   Wt 213 lb (96.6 kg)   SpO2 99%   BMI 28.10 kg/m  Wt Readings from Last 3 Encounters:  03/22/24 213 lb (96.6 kg)  03/17/24 248 lb (112.5 kg)  02/06/24 220 lb (99.8 kg)    No results found for: TSH Lab Results  Component Value Date   WBC 4.1 03/17/2024   HGB 12.4 (L) 03/17/2024   HCT 37.0 (L) 03/17/2024   MCV 79.4 (L) 03/17/2024   PLT  155 03/17/2024   Lab Results  Component Value Date   NA 138 03/17/2024   K 5.0 03/17/2024   CO2 27 03/17/2024   GLUCOSE 97 03/17/2024   BUN 16 03/17/2024   CREATININE 1.01 03/17/2024   BILITOT 0.4 03/17/2024   ALKPHOS 70 03/17/2024   AST 21 03/17/2024   ALT 15 03/17/2024   PROT 6.0 (L) 03/17/2024   ALBUMIN 4.0 03/17/2024   CALCIUM 8.9 03/17/2024   ANIONGAP 9 03/17/2024   EGFR 78 11/21/2023   No results found for: CHOL No results found for: HDL No results found for: LDLCALC No results found for: TRIG No results found for: CHOLHDL Lab Results  Component Value Date   HGBA1C 6.2 12/03/2008      Assessment & Plan:   Problem List Items Addressed This Visit       Cardiovascular and Mediastinum   Primary hypertension   Well-controlled on olmesartan -hydrochlorothiazide 40-25 mg 1 tablet daily Continue current medication DASH diet and commitment to daily physical activity for a minimum of 30 minutes discussed and encouraged, as a part of hypertension management. The importance of attaining a healthy weight is also discussed.     03/22/2024    1:08 PM 03/17/2024   11:15 AM 03/17/2024    9:42 AM 03/17/2024    9:39 AM 03/17/2024    9:05 AM 02/06/2024   10:45 AM 02/06/2024   10:37 AM  BP/Weight  Systolic BP 118 137 151  148 128 133  Diastolic BP 68 85 79  94 67 86  Wt. (Lbs) 213   248   220  BMI 28.1 kg/m2   32.72 kg/m2   29.03 kg/m2             Other   Alcohol use disorder   Cessation encouraged      Dizziness   Patient reports intermittent dizziness but mot currently, dizziness occurs after  taking BP meds   Discussed making changes to his blood pressure medication but the patient declined He was encouraged to monitor blood pressure at home and report hypotension      Chronic abdominal pain - Primary   Continue dicyclomine  20 mg 3 times daily as needed Advised to follow-up with GI if no improvement Diverticulosis education completed      Anemia   Lab Results  Component Value Date   WBC 4.1 03/17/2024   HGB 12.4 (L) 03/17/2024   HCT 37.0 (L) 03/17/2024   MCV 79.4 (L) 03/17/2024   PLT 155 03/17/2024  Patient denies abnormal bleeding We will recheck CBC in 1 week      Relevant Orders   CBC   Encounter for examination following treatment at hospital   Hospital chart reviewed, including discharge summary Medications reconciled and reviewed with the patient in detail       Constipation   Continue MiraLAX as needed and Metamucil daily Recommended 64 ounces of water daily to maintain hydration       No orders of the defined types were placed in this encounter.   Follow-up: Return in about 4 months (around 07/23/2024) for HTN.    Hong Moring R Langley Ingalls, FNP

## 2024-03-22 NOTE — Assessment & Plan Note (Signed)
 Cessation encouraged

## 2024-03-22 NOTE — Assessment & Plan Note (Addendum)
 Patient reports intermittent dizziness but mot currently, dizziness occurs after taking BP meds   Discussed making changes to his blood pressure medication but the patient declined He was encouraged to monitor blood pressure at home and report hypotension

## 2024-03-22 NOTE — Assessment & Plan Note (Addendum)
 Continue dicyclomine  20 mg 3 times daily as needed Advised to follow-up with GI if no improvement Diverticulosis education completed

## 2024-03-23 ENCOUNTER — Telehealth: Payer: Self-pay

## 2024-03-23 NOTE — Transitions of Care (Post Inpatient/ED Visit) (Signed)
   03/23/2024  Name: Gualberto Wahlen MRN: [id number] DOB: Jul 18, 1956  Today's TOC FU Call Status: Today's TOC FU Call Status:: Unsuccessful Call (1st Attempt) Unsuccessful Call (1st Attempt) Date: 03/23/24  Attempted to reach the patient regarding the most recent Inpatient/ED visit.  Follow Up Plan: Additional outreach attempts will be made to reach the patient to complete the Transitions of Care (Post Inpatient/ED visit) call.   Signature  [employer], [place]

## 2024-03-23 NOTE — Telephone Encounter (Signed)
 Patient had an appointment yesterday with [facility] 03/22/2024

## 2024-03-23 NOTE — Telephone Encounter (Signed)
 Copied from CRM [id number]. Topic: General - Other >> Mar 23, 2024 10:42 AM Donna BRAVO wrote: Reason for CRM: patient returning missed call from  [facility] and would like to speak with her.

## 2024-04-06 ENCOUNTER — Ambulatory Visit: Payer: Self-pay | Admitting: Nurse Practitioner

## 2024-04-08 ENCOUNTER — Ambulatory Visit: Payer: Self-pay | Admitting: Nurse Practitioner

## 2024-04-12 ENCOUNTER — Encounter

## 2024-04-14 ENCOUNTER — Ambulatory Visit: Admitting: Podiatry

## 2024-04-16 ENCOUNTER — Ambulatory Visit: Admitting: Podiatry

## 2024-04-16 ENCOUNTER — Encounter: Payer: Self-pay | Admitting: Podiatry

## 2024-04-16 ENCOUNTER — Ambulatory Visit

## 2024-04-16 DIAGNOSIS — Q666 Other congenital valgus deformities of feet: Secondary | ICD-10-CM

## 2024-04-16 DIAGNOSIS — M79672 Pain in left foot: Secondary | ICD-10-CM | POA: Diagnosis not present

## 2024-04-16 NOTE — Progress Notes (Signed)
  Subjective:  Patient ID: Donald Powers, male    DOB: 12/27/1955,  MRN: [id number]  Chief Complaint  Patient presents with   Foot Pain    Pt stated that he is having some discomfort with his feet and it goes up to his legs     68 y.o. male presents with the above complaint.  Patient presents with past planovalgus foot deformity he states that is causing him some arch pain he stands a lot working for the post [employer] denies any other acute complaints would like to discuss treatment options for this hurts with ambulation or shoe pressure started noticed pain go up the leg and into the knees.   Review of Systems: Negative except as noted in the HPI. Denies N/V/F/Ch.  Past Medical History:  Diagnosis Date   Alcoholic liver disease (HCC)    Hypertension     Current Outpatient Medications:    celecoxib (CELEBREX) 200 MG capsule, Take 200 mg by mouth 2 (two) times daily as needed. (Patient not taking: Reported on 03/22/2024), Disp: , Rfl:    dicyclomine  (BENTYL ) 20 MG tablet, Take 1 tablet (20 mg total) by mouth 3 (three) times daily as needed (abd spasms)., Disp: 20 tablet, Rfl: 0   olmesartan -hydrochlorothiazide (BENICAR  HCT) 40-25 MG tablet, Take 1 tablet by mouth daily., Disp: 90 tablet, Rfl: 1   polyethylene glycol powder (MIRALAX) 17 GM/SCOOP powder, , Disp: , Rfl:    sildenafil (REVATIO) 20 MG tablet, , Disp: , Rfl:   Social History   Tobacco Use  Smoking Status Never  Smokeless Tobacco Never    No Known Allergies Objective:  There were no vitals filed for this visit. There is no height or weight on file to calculate BMI. Constitutional Well developed. Well nourished.  Vascular Dorsalis pedis pulses palpable bilaterally. Posterior tibial pulses palpable bilaterally. Capillary refill normal to all digits.  No cyanosis or clubbing noted. Pedal hair growth normal.  Neurologic Normal speech. Oriented to person, place, and time. Epicritic sensation to light touch grossly  present bilaterally.  Dermatologic Nails well groomed and normal in appearance. No open wounds. No skin lesions.  Orthopedic: Gait examination shows pes planovalgus deformity with calcaneovalgus to many toe signs partially but recreate the arch with dorsiflexion of the hallux unable to perform single and double heel raise.   Radiographs: None Assessment:   1. Left foot pain   2. Pes planovalgus    Plan:  Patient was evaluated and treated and all questions answered.  Pes planovalgus -I explained to patient the etiology of pes planovalgus and relationship with Planter fasciitis and various treatment options were discussed.  Given patient foot structure in the setting of Planter fasciitis I believe patient will benefit from custom-made orthotics to help control the hindfoot motion support the arch of the foot and take the stress away from plantar fascial.  Patient agrees with the plan like to proceed with orthotics -Patient was casted for orthotics   No follow-ups on file.

## 2024-04-29 ENCOUNTER — Telehealth: Payer: Self-pay | Admitting: Physician Assistant

## 2024-04-29 NOTE — Telephone Encounter (Signed)
 Called pt left vm for pt to reschedule appt with Ronal Caldron. Please transfer call to Crystal when pt returns call

## 2024-04-30 ENCOUNTER — Encounter (HOSPITAL_BASED_OUTPATIENT_CLINIC_OR_DEPARTMENT_OTHER): Payer: Self-pay | Admitting: Physician Assistant

## 2024-04-30 ENCOUNTER — Ambulatory Visit (HOSPITAL_BASED_OUTPATIENT_CLINIC_OR_DEPARTMENT_OTHER): Admitting: Physician Assistant

## 2024-04-30 DIAGNOSIS — M25562 Pain in left knee: Secondary | ICD-10-CM | POA: Diagnosis not present

## 2024-04-30 DIAGNOSIS — M25561 Pain in right knee: Secondary | ICD-10-CM

## 2024-04-30 MED ORDER — TRIAMCINOLONE ACETONIDE 40 MG/ML IJ SUSP
2.0000 mL | INTRAMUSCULAR | Status: AC | PRN
  Administered 2024-04-30: 2 mL via INTRA_ARTICULAR

## 2024-04-30 MED ORDER — LIDOCAINE HCL 1 % IJ SOLN
4.0000 mL | INTRAMUSCULAR | Status: AC | PRN
  Administered 2024-04-30: 4 mL

## 2024-04-30 NOTE — Progress Notes (Signed)
 Office Visit Note   Patient: Donald Powers           Date of Birth: [birth year]           MRN: [id number] Visit Date: 04/30/2024              Requested by: Juanice Thomes SAUNDERS, FNP [phone] [address], [address] [place],  [place] [address] PCP: [name], [name], FNP  Chief Complaint  Patient presents with   Right Knee - Pain   Left Knee - Pain      HPI: 68 year old male presents with two week history of bilateral knee pain. Describes as constant ache. Radiates along anterior aspect of tibia. Began when he started wearing new New Balance sneakers. Patient went to [facility] for treatment of bilateral foot pain. 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 Was having foot pain. Fit for inserts; has not received yet. Advised to go buy new sneakers; [employer], [employer], [employer], or [employer]. Patient went to [employer] and was fit for new shoes. Purchased new New Balance sneakers. Pain feels similar to bilateral knee pain that he had when seen at [facility] clinic in early June 2025. Seen on 6/6, bilateral knee xrays revealed moderate degenerative changes with some loss of joint space. Patient received bilateral corticosteroid injections with significant improvement. Reports had 100% resolution of knee pain within a few days. Denies knee popping/clicking/grinding, locking/catching, foot numbness/tingling, instability/giving out sensation. Patient wears compression socks for bilateral peripheral edema; rare, present with a full day of work. Patient does not have diabetes.  Assessment & Plan: Visit Diagnoses:  1. Pain in both knees, unspecified chronicity     Plan: 68 year old male presents with two week history of bilateral knee pain. Bilateral osteoarthritis. Previous significant improvement with bilateral corticosteroid injections on 6/6. Requested bilateral knee injections again. Discussed case with Ronal Caldron Muaad Boehning PA-C (present in clinic today). Provided  patient with bilateral corticosteroid knee injections. Patient tolerated procedure well. Advised patient to continue management per [facility]. Advised patient to return to [facility] if no significant symptom improvement in the next 1-2 weeks, sooner with any issues/concerns.   Follow-Up Instructions: Return if symptoms worsen or fail to improve.   Ortho Exam  Patient is alert, oriented, no adenopathy, well-dressed, normal affect, normal respiratory effort. Bilateral knees normal to inspection. No erythema, edema, or ecchymosis. No effusion. No tenderness with palpation. Full ROM. 5/5 motor strength. Negative anterior and posterior drawer testing. Negative Lachman's test. No pain or laxity with varus or valgus stress testing. Mildly antalgic gait. No tenderness with palpation along bilateral tibial prominence. No current peripheral edema. Sensation intact. Brisk capillary refill.    Imaging: No results found. No images are attached to the encounter. Bilateral knee xrays from 02/13/24 reviewed. Revealed mild degenerative arthritic changes.  Labs: Lab Results  Component Value Date   HGBA1C 6.2 12/03/2008     Lab Results  Component Value Date   ALBUMIN 4.0 03/17/2024   ALBUMIN 4.3 11/21/2023   ALBUMIN 3.7 03/04/2015    No results found for: MG No results found for: VD25OH  No results found for: PREALBUMIN    Latest Ref Rng & Units 03/17/2024    9:36 AM 11/21/2023    1:04 PM 03/04/2015   12:37 PM  CBC EXTENDED  WBC 4.0 - 10.5 K/uL 4.1  4.2  4.5   RBC 4.22 - 5.81 MIL/uL 4.66  5.16  5.09   Hemoglobin 13.0 - 17.0 g/dL 87.5  86.3  86.2   HCT 39.0 - 52.0 % 37.0  41.6  41.2   Platelets 150 - 400 K/uL 155  195  181   NEUT# 1.7 - 7.7 K/uL 1.4   2.0   Lymph# 0.7 - 4.0 K/uL 2.0   1.9      There is no height or weight on file to calculate BMI.  Orders:  No orders of the defined types were placed in this encounter.  No orders of the defined types were placed in this  encounter.    Procedures: Large Joint Inj: bilateral knee on 04/30/2024 3:43 PM Indications: pain Details: 22 G 1.5 in needle, anteromedial approach Medications (Right): 4 mL lidocaine  1 %; 2 mL triamcinolone  acetonide 40 MG/ML Medications (Left): 2 mL triamcinolone  acetonide 40 MG/ML; 4 mL lidocaine  1 % Outcome: tolerated well, no immediate complications Procedure, treatment alternatives, risks and benefits explained, specific risks discussed. Consent was given by the patient.      Clinical Data: No additional findings.  ROS:  All other systems negative, except as noted in the HPI. Review of Systems  Objective: Vital Signs: There were no vitals taken for this visit.  Specialty Comments:  No specialty comments available.  PMFS History: Patient Active Problem List   Diagnosis Date Noted   Lower abdominal pain 03/22/2024   Anemia 03/22/2024   Encounter for examination following treatment at hospital 03/22/2024   Constipation 03/22/2024   Bilateral knee pain 02/13/2024   Primary osteoarthritis of right knee 02/06/2024   Primary osteoarthritis of left knee 02/06/2024   Lower extremity edema 02/06/2024   Alcohol use disorder 11/21/2023   Primary hypertension 11/21/2023   Dizziness 11/21/2023   Alcoholic liver disease (HCC) 11/21/2023   Chronic abdominal pain 11/21/2023   Past Medical History:  Diagnosis Date   Alcoholic liver disease (HCC)    Hypertension     Family History  Problem Relation Age of Onset   Diabetes Mother    Heart disease Mother    Liver disease Neg Hx    Colon cancer Neg Hx    Esophageal cancer Neg Hx     History reviewed. No pertinent surgical history. Social History   Occupational History   Not on file  Tobacco Use   Smoking status: Never   Smokeless tobacco: Never  Vaping Use   Vaping status: Never Used  Substance and Sexual Activity   Alcohol use: Yes    Comment: occ   Drug use: No   Sexual activity: Not on file

## 2024-05-04 ENCOUNTER — Telehealth (HOSPITAL_BASED_OUTPATIENT_CLINIC_OR_DEPARTMENT_OTHER): Payer: Self-pay | Admitting: Physician Assistant

## 2024-05-04 ENCOUNTER — Other Ambulatory Visit (HOSPITAL_BASED_OUTPATIENT_CLINIC_OR_DEPARTMENT_OTHER): Payer: Self-pay | Admitting: Physician Assistant

## 2024-05-04 MED ORDER — MELOXICAM 7.5 MG PO TABS: 7.5000 mg | ORAL_TABLET | Freq: Every day | ORAL | 0 refills | Status: AC

## 2024-05-04 NOTE — Telephone Encounter (Signed)
 Patient wants to know if he meds were sent to his pharmacy from his visit on last week.

## 2024-05-04 NOTE — Telephone Encounter (Signed)
 Patient called back. States provider mentioned prescribing a medication to take for some time after injections to help with pain. Asked if Meloxicam  sounded familiar. Patient unsure. Agreed to prescribe two week course of Meloxicam . Advised patient to take with food. Checked last BUN, Cr, and eGFR - no concerns. Patient not currently taking Celebrex; advised patient not to take Celebrex while on Meloxicam . Patient acknowledged/understood.

## 2024-05-04 NOTE — Telephone Encounter (Signed)
 Reviewed note. Unclear which medications patient is referring to. Called patient. No answer. LVM.

## 2024-05-05 ENCOUNTER — Telehealth: Payer: Self-pay | Admitting: Podiatry

## 2024-05-05 NOTE — Telephone Encounter (Signed)
 LVM to schedule orthotic fitting/ pu

## 2024-05-07 ENCOUNTER — Ambulatory Visit: Admitting: Physician Assistant

## 2024-05-12 ENCOUNTER — Ambulatory Visit: Admitting: Podiatry

## 2024-05-14 ENCOUNTER — Other Ambulatory Visit

## 2024-05-28 ENCOUNTER — Other Ambulatory Visit: Payer: Self-pay

## 2024-05-28 DIAGNOSIS — R600 Localized edema: Secondary | ICD-10-CM

## 2024-06-14 ENCOUNTER — Other Ambulatory Visit

## 2024-06-23 ENCOUNTER — Ambulatory Visit

## 2024-06-23 NOTE — Progress Notes (Signed)
 Patient presents today to pick up custom molded foot orthotics, diagnosed with Left foot pain BIL foot deformity with overpronation by Dr. tobie.   Orthotics were dispensed and fit was satisfactory. Reviewed instructions for break-in and wear. Written instructions given to patient.  Patient will follow up as needed.   Lolita Schultze CPed, CFo, CFm

## 2024-07-01 ENCOUNTER — Ambulatory Visit: Admitting: Vascular Surgery

## 2024-07-01 ENCOUNTER — Encounter: Payer: Self-pay | Admitting: Vascular Surgery

## 2024-07-01 ENCOUNTER — Ambulatory Visit (HOSPITAL_COMMUNITY)
Admission: RE | Admit: 2024-07-01 | Discharge: 2024-07-01 | Disposition: A | Discharge status: Home / Self Care | Source: Ambulatory Visit | Attending: Surgery | Admitting: Surgery

## 2024-07-01 VITALS — BP 129/83 | HR 60 | Temp 98.1°F | Resp 20 | Ht 73.0 in | Wt 222.4 lb

## 2024-07-01 DIAGNOSIS — I872 Venous insufficiency (chronic) (peripheral): Secondary | ICD-10-CM | POA: Diagnosis present

## 2024-07-01 DIAGNOSIS — R600 Localized edema: Secondary | ICD-10-CM

## 2024-07-01 NOTE — Progress Notes (Signed)
 Office Note     CC: Bilateral lower extreme edema Requesting Provider:  Juanice Thomes SAUNDERS, FNP  HPI: Donald Powers is a 68 y.o. ([birth year]) male who presents at the request of [name], [name], FNP for evaluation of bilateral lower extremity edema.  On exam, [name] was doing well.  Native of [place] , he moved to his mother's hometown of [place] as a child.  He is a Buyer, retail of [name].  [name] continues to work for time for the [employer], where he has been employed for the last 10 years.  Over the last few years, he has appreciated bilateral lower extremity edema.  This is most appreciated at the level of the ankles and in the calves, however sometimes runs into the foot.  He wears compression stockings on a daily basis, and states that this helps tremendously.  He has also changed his shoes to a much more supportive Brooks. He notes the swelling is usually best in the morning, and worse in the evenings.  He is on his feet for the majority of the day, and when he goes home sits in the dependent position. Denies bleeding, denies ulceration, denies itching, burning.  No history of DVT, no history of venous procedures   Past Medical History:  Diagnosis Date   Alcoholic liver disease    Hypertension     History reviewed. No pertinent surgical history.  Social History   Socioeconomic History   Marital status: Divorced    Spouse name: Not on file   Number of children: 2   Years of education: Not on file   Highest education level: Not on file  Occupational History   Not on file  Tobacco Use   Smoking status: Never   Smokeless tobacco: Never  Vaping Use   Vaping status: Never Used  Substance and Sexual Activity   Alcohol use: Yes    Comment: occ   Drug use: No   Sexual activity: Not on file  Other Topics Concern   Not on file  Social History Narrative   Lives with his brother    Social Drivers of Corporate investment banker Strain:  Not on file  Food Insecurity: Not on file  Transportation Needs: Not on file  Physical Activity: Not on file  Stress: Not on file  Social Connections: Not on file  Intimate Partner Violence: Not on file   Family History  Problem Relation Age of Onset   Diabetes Mother    Heart disease Mother    Liver disease Neg Hx    Colon cancer Neg Hx    Esophageal cancer Neg Hx     Current Outpatient Medications  Medication Sig Dispense Refill   dicyclomine  (BENTYL ) 20 MG tablet Take 1 tablet (20 mg total) by mouth 3 (three) times daily as needed (abd spasms). 20 tablet 0   olmesartan -hydrochlorothiazide (BENICAR  HCT) 40-25 MG tablet Take 1 tablet by mouth daily. 90 tablet 1   polyethylene glycol powder (MIRALAX) 17 GM/SCOOP powder      No current facility-administered medications for this visit.    No Known Allergies   REVIEW OF SYSTEMS:   [X]  denotes positive finding, [ ]  denotes negative finding Cardiac  Comments:  Chest pain or chest pressure:    Shortness of breath upon exertion:    Short of breath when lying flat:    Irregular heart rhythm:        Vascular    Pain in calf, thigh, or  hip brought on by ambulation:    Pain in feet at night that wakes you up from your sleep:     Blood clot in your veins:    Leg swelling:         Pulmonary    Oxygen at home:    Productive cough:     Wheezing:         Neurologic    Sudden weakness in arms or legs:     Sudden numbness in arms or legs:     Sudden onset of difficulty speaking or slurred speech:    Temporary loss of vision in one eye:     Problems with dizziness:         Gastrointestinal    Blood in stool:     Vomited blood:         Genitourinary    Burning when urinating:     Blood in urine:        Psychiatric    Major depression:         Hematologic    Bleeding problems:    Problems with blood clotting too easily:        Skin    Rashes or ulcers:        Constitutional    Fever or chills:      PHYSICAL  EXAMINATION:  Vitals:   07/01/24 1223  BP: 129/83  Pulse: 60  Resp: 20  Temp: 98.1 F (36.7 C)  TempSrc: Temporal  SpO2: 99%  Weight: 222 lb 6.4 oz (100.9 kg)  Height: 6' 1 (1.854 m)    General:  WDWN in NAD; vital signs documented above Gait: Not observed HENT: WNL, normocephalic Pulmonary: normal non-labored breathing , without Rales, rhonchi,  wheezing Cardiac: regular HR Abdomen: soft, NT, no masses Skin: without rashes Vascular Exam/Pulses:  Right Left  Radial 2+ (normal) 2+ (normal)  Ulnar    Femoral    Popliteal    DP 2+ (normal) 2+ (normal)  PT     Extremities: without ischemic changes, without Gangrene , without cellulitis; without open wounds;  Musculoskeletal: no muscle wasting or atrophy  Neurologic: A&O X 3;  No focal weakness or paresthesias are detected Psychiatric:  The pt has Normal affect.   Non-Invasive Vascular Imaging:   Venous Reflux Times  +--------------+---------+------+-----------+------------+---------+  RIGHT        Reflux NoRefluxReflux TimeDiameter cmsComments                           Yes                                    +--------------+---------+------+-----------+------------+---------+  CFV                    yes   >1 second                        +--------------+---------+------+-----------+------------+---------+  FV mid        no                                               +--------------+---------+------+-----------+------------+---------+  Popliteal    no                                               +--------------+---------+------+-----------+------------+---------+  GSV at [facility]    no                            0.46               +--------------+---------+------+-----------+------------+---------+  GSV prox thigh          yes    >500 ms      0.37               +--------------+---------+------+-----------+------------+---------+  GSV mid thigh no                             0.27               +--------------+---------+------+-----------+------------+---------+  GSV dist thighno                            0.22               +--------------+---------+------+-----------+------------+---------+  GSV at knee   no                            0.15               +--------------+---------+------+-----------+------------+---------+  GSV prox calf no                            0.14               +--------------+---------+------+-----------+------------+---------+  GSV mid calf  no                            0.17               +--------------+---------+------+-----------+------------+---------+  SSV at [facility]    no                            0.15               +--------------+---------+------+-----------+------------+---------+  SSV prox calf                                       too small  +--------------+---------+------+-----------+------------+---------+  SSV mid calf  no                            0.12               +--------------+---------+------+-----------+------------+---------+     ASSESSMENT/PLAN:: 68 y.o. male presenting with bilateral lower extremity edema, successfully managed with lower extremity compression.  On exam, he had 1+ pitting edema.  No concern for ulceration, or skin break.  There were small telangiectasias appreciated on bilateral lower legs.  No chronic skin changes He had an excellent palpable pulse bilaterally.  Imaging was reviewed demonstrating no significant reflux within the greater saphenous vein or in the deep system.  There is a small amount of focal reflux in the level of the proximal thigh and at the level of the common femoral vein.  I had a long conversation with Rangel regarding the above.  He would benefit most from continued conservative therapy.  I do not think that he would have significant benefit, and with the small size of his greater saphenous vein is not even a  candidate for laser ablation.  We discussed the importance of compression, elevation, exercise.  I am available should any questions or concerns arise    Fonda FORBES Rim, MD [facility] [phone]

## 2024-07-17 ENCOUNTER — Ambulatory Visit
Admission: EM | Admit: 2024-07-17 | Discharge: 2024-07-17 | Disposition: A | Discharge status: Home / Self Care | Attending: Family Medicine | Admitting: Family Medicine

## 2024-07-17 DIAGNOSIS — B9789 Other viral agents as the cause of diseases classified elsewhere: Secondary | ICD-10-CM | POA: Diagnosis not present

## 2024-07-17 DIAGNOSIS — R052 Subacute cough: Secondary | ICD-10-CM

## 2024-07-17 DIAGNOSIS — J988 Other specified respiratory disorders: Secondary | ICD-10-CM

## 2024-07-17 LAB — POC COVID19/FLU A&B COMBO
Covid Antigen, POC: NEGATIVE
Influenza A Antigen, POC: NEGATIVE
Influenza B Antigen, POC: NEGATIVE

## 2024-07-17 MED ORDER — PSEUDOEPHEDRINE HCL 30 MG PO TABS: 30.0000 mg | ORAL_TABLET | Freq: Three times a day (TID) | ORAL | 0 refills | Status: DC | PRN

## 2024-07-17 MED ORDER — CETIRIZINE HCL 10 MG PO TABS: 10.0000 mg | ORAL_TABLET | Freq: Every day | ORAL | 0 refills | Status: AC

## 2024-07-17 MED ORDER — PROMETHAZINE-DM 6.25-15 MG/5ML PO SYRP: 5.0000 mL | ORAL_SOLUTION | Freq: Every evening | ORAL | 0 refills | Status: DC | PRN

## 2024-07-17 MED ORDER — IBUPROFEN 600 MG PO TABS: 600.0000 mg | ORAL_TABLET | Freq: Four times a day (QID) | ORAL | 0 refills | Status: AC | PRN

## 2024-07-17 NOTE — ED Triage Notes (Signed)
 Pt c/o dry cough, head congestion, HA and body aches x 2 days-denies fever-taking tylenol, nyquil-NAD-steady gait

## 2024-07-17 NOTE — Discharge Instructions (Signed)
 We will manage this as a viral illness. For sore throat or cough try using a honey-based tea. Use 3 teaspoons of honey with juice squeezed from half lemon. Place shaved pieces of ginger into 1/2-1 cup of water and warm over stove top. Then mix the ingredients and repeat every 4 hours as needed. Please take ibuprofen 600mg  every 6 hours with food for throat pain, fevers, aches and pains. Hydrate very well with at least 2 liters of water. Eat light meals such as soups (chicken and noodles, vegetable, chicken and wild rice).  Do not eat foods that you are allergic to.  Taking an antihistamine like Zyrtec (10mg  daily) can help against postnasal drainage, sinus congestion which can cause sinus pain, sinus headaches, throat pain, painful swallowing, coughing.  You can take this together with pseudoephedrine (Sudafed) at a dose of 30mg  3 times a day or twice daily as needed for the same kind of nasal drip, congestion.  Use cough syrup as needed.

## 2024-07-17 NOTE — ED Provider Notes (Signed)
 [facility]  Note:  This document was prepared using Conservation officer, historic buildings and may include unintentional dictation errors.  MRN: [id number] DOB: 1956/06/30  Subjective:   Donald Powers is a 68 y.o. male presenting for 2 day history of acute onset dry cough, sinus congestion, headaches, body aches, malaise and fatigue. Wants COVID, flu testing. No chest pain, shob, wheezing.  No asthma.  No smoking of any kind including cigarettes, cigars, vaping, marijuana use.    No current facility-administered medications for this encounter.  Current Outpatient Medications:    dicyclomine  (BENTYL ) 20 MG tablet, Take 1 tablet (20 mg total) by mouth 3 (three) times daily as needed (abd spasms)., Disp: 20 tablet, Rfl: 0   olmesartan -hydrochlorothiazide (BENICAR  HCT) 40-25 MG tablet, Take 1 tablet by mouth daily., Disp: 90 tablet, Rfl: 1   polyethylene glycol powder (MIRALAX) 17 GM/SCOOP powder, , Disp: , Rfl:    No Known Allergies  Past Medical History:  Diagnosis Date   Alcoholic liver disease    Hypertension      History reviewed. No pertinent surgical history.  Family History  Problem Relation Age of Onset   Diabetes Mother    Heart disease Mother    Liver disease Neg Hx    Colon cancer Neg Hx    Esophageal cancer Neg Hx     Social History   Tobacco Use   Smoking status: Never   Smokeless tobacco: Never  Vaping Use   Vaping status: Never Used  Substance Use Topics   Alcohol use: Yes    Comment: occ   Drug use: No    ROS   Objective:   Vitals: BP 137/86 (BP Location: Left Arm)   Pulse 76   Temp 98.5 F (36.9 C) (Oral)   Resp 20   SpO2 96%   Physical Exam Constitutional:      General: He is not in acute distress.    Appearance: Normal appearance. He is well-developed and normal weight. He is not ill-appearing, toxic-appearing or diaphoretic.  HENT:     Head: Normocephalic and atraumatic.     Right Ear: Tympanic membrane, ear  canal and external ear normal. No drainage, swelling or tenderness. No middle ear effusion. There is no impacted cerumen. Tympanic membrane is not erythematous or bulging.     Left Ear: Tympanic membrane, ear canal and external ear normal. No drainage, swelling or tenderness.  No middle ear effusion. There is no impacted cerumen. Tympanic membrane is not erythematous or bulging.     Nose: Nose normal. No congestion or rhinorrhea.     Mouth/Throat:     Mouth: Mucous membranes are moist.     Pharynx: No oropharyngeal exudate or posterior oropharyngeal erythema.  Eyes:     General: No scleral icterus.       Right eye: No discharge.        Left eye: No discharge.     Extraocular Movements: Extraocular movements intact.     Conjunctiva/sclera: Conjunctivae normal.  Cardiovascular:     Rate and Rhythm: Normal rate and regular rhythm.     Heart sounds: Normal heart sounds. No murmur heard.    No friction rub. No gallop.  Pulmonary:     Effort: Pulmonary effort is normal. No respiratory distress.     Breath sounds: Normal breath sounds. No stridor. No wheezing, rhonchi or rales.  Musculoskeletal:     Cervical back: Normal range of motion and neck supple. No rigidity. No muscular tenderness.  Neurological:     General: No focal deficit present.     Mental Status: He is alert and oriented to person, place, and time.  Psychiatric:        Mood and Affect: Mood normal.        Behavior: Behavior normal.        Thought Content: Thought content normal.     Results for orders placed or performed during the hospital encounter of 07/17/24 (from the past 24 hours)  POC Covid19/Flu A&B Antigen     Status: None   Collection Time: 07/17/24 10:19 AM  Result Value Ref Range   Influenza A Antigen, POC Negative Negative   Influenza B Antigen, POC Negative Negative   Covid Antigen, POC Negative Negative    Assessment and Plan :   PDMP not reviewed this encounter.  1. Viral respiratory infection   2.  Subacute cough    Deferred imaging given clear cardiopulmonary exam, hemodynamically stable vital signs. Suspect viral URI, viral syndrome. Physical exam findings reassuring and vital signs stable for discharge. Advised supportive care, offered symptomatic relief. Counseled patient on potential for adverse effects with medications prescribed/recommended today, ER and return-to-clinic precautions discussed, patient verbalized understanding.     Christopher Savannah, PA-C 07/17/24 1114

## 2024-07-23 ENCOUNTER — Ambulatory Visit: Payer: Self-pay | Admitting: Nurse Practitioner

## 2024-07-23 VITALS — BP 132/68 | HR 69 | Wt 221.0 lb

## 2024-07-23 DIAGNOSIS — M79671 Pain in right foot: Secondary | ICD-10-CM

## 2024-07-23 DIAGNOSIS — M79672 Pain in left foot: Secondary | ICD-10-CM

## 2024-07-23 DIAGNOSIS — R600 Localized edema: Secondary | ICD-10-CM

## 2024-07-23 DIAGNOSIS — K709 Alcoholic liver disease, unspecified: Secondary | ICD-10-CM

## 2024-07-23 DIAGNOSIS — F1090 Alcohol use, unspecified, uncomplicated: Secondary | ICD-10-CM

## 2024-07-23 DIAGNOSIS — N400 Enlarged prostate without lower urinary tract symptoms: Secondary | ICD-10-CM

## 2024-07-23 DIAGNOSIS — Z125 Encounter for screening for malignant neoplasm of prostate: Secondary | ICD-10-CM | POA: Diagnosis not present

## 2024-07-23 DIAGNOSIS — I10 Essential (primary) hypertension: Secondary | ICD-10-CM

## 2024-07-23 DIAGNOSIS — Z23 Encounter for immunization: Secondary | ICD-10-CM

## 2024-07-23 DIAGNOSIS — G8929 Other chronic pain: Secondary | ICD-10-CM

## 2024-07-23 DIAGNOSIS — K5730 Diverticulosis of large intestine without perforation or abscess without bleeding: Secondary | ICD-10-CM

## 2024-07-23 MED ORDER — OLMESARTAN MEDOXOMIL-HCTZ 40-25 MG PO TABS: 1.0000 | ORAL_TABLET | Freq: Every day | ORAL | 1 refills | Status: AC

## 2024-07-23 NOTE — Assessment & Plan Note (Signed)
 Reported Bunion of foot and flat foot (pes planus) Bunion pain managed with ibuprofen and ice. Advised to use comfortable, well-padded shoes. - Advised use of Tylenol 650 mg every 6 hours as needed for pain. - Recommended wearing comfortable, well-padded shoes.

## 2024-07-23 NOTE — Assessment & Plan Note (Signed)
 Advised to limit alcohol intake to less than 2 a day, and even better avoid alcohol altogether

## 2024-07-23 NOTE — Assessment & Plan Note (Signed)
 BP Readings from Last 3 Encounters:  07/23/24 132/68  07/17/24 137/86  07/01/24 129/83   Essential hypertension Blood pressure controlled at 132/68 mmHg with current medication. - Continue olmesartan -hydrochlorothiazide 40-25 mg orally daily. Discussed DASH diet and dietary sodium restrictions Continue to increase dietary efforts and exercise.

## 2024-07-23 NOTE — Assessment & Plan Note (Signed)
 Diverticulosis of colon Diverticulosis confirmed by colonoscopy. No symptoms of bleeding or severe pain, mild abdominal pain reported. - Advised high-fiber diet and regular exercise. - Instructed to monitor for signs of bleeding or severe pain.

## 2024-07-23 NOTE — Assessment & Plan Note (Signed)
 Alcohol-related liver disease and alcohol use disorder Alcohol limited to special occasions. Previous ultrasound showed no significant fatty tissue. Advised to avoid alcohol due to liver disease risk. - Advised to avoid alcohol consumption. - Encouraged follow-up with gastroenterologist if abdominal pain persists.

## 2024-07-23 NOTE — Assessment & Plan Note (Signed)
 Benign prostatic hyperplasia Mildly enlarged prostate on CT scan. No urinary symptoms reported. - Ordered PSA test.

## 2024-07-23 NOTE — Patient Instructions (Signed)

## 2024-07-23 NOTE — Assessment & Plan Note (Signed)
 No edema noted today  Edema managed with compression socks. No significant swelling reported. - Continue use of compression socks.

## 2024-07-23 NOTE — Progress Notes (Signed)
 Established Patient Office Visit  Subjective:  Patient ID: Donald Powers, male    DOB: 01-30-1956  Age: 68 y.o. MRN: [id number]  CC:  Chief Complaint  Patient presents with   Hypertension    HPI  Discussed the use of AI scribe software for clinical note transcription with the patient, who gave verbal consent to proceed.  History of Present Illness Donald Powers is a 68 year old male with hypertension and alcohol-related liver disease who presents for a follow-up visit and thorough checkup.  He is here for a follow-up regarding his hypertension. He adheres to his medication regimen, taking olmesartan -hydrochlorothiazide 40/25 mg once daily.  He desires a comprehensive evaluation, including checks for colon cancer, liver, and kidney function. He had a colonoscopy in 2023 winch showed hemorrhoids  Diverticulosis of large intestine without perforation or abscess without bleeding . He is not due for another until 2033. He has not experienced any bleeding, and his last blood count was slightly low during an emergency room visit, prompting a recheck today.He experiences occasional abdominal cramps but no severe symptoms. He denies smoking and reports drinking alcohol only on special occasions, with the last instance being at a homecoming event.   He was referred to a gastroenterologist in March and saw him in May,. A recent ultrasound showed low likelihood for fibrosis . No current severe abdominal pain or bleeding.  A mildly enlarged prostate noted from a CT scan in July but denies urinary symptoms such as difficulty urinating, hematuria, or nocturia. He has not had a recent prostate-specific antigen (PSA) test.  He uses compression socks for leg swelling, which has improved. He also reports a painful bunion on his foot, for which he uses ice and ibuprofen. He has flat feet, which he attributes to family history.  He recently had an upper respiratory infection, which has resolved, and  received a flu vaccine today. He takes a multivitamin.     Assessment & Plan      Past Medical History:  Diagnosis Date   Alcoholic liver disease    Hypertension     History reviewed. No pertinent surgical history.  Family History  Problem Relation Age of Onset   Diabetes Mother    Heart disease Mother    Liver disease Neg Hx    Colon cancer Neg Hx    Esophageal cancer Neg Hx     Social History   Socioeconomic History   Marital status: Divorced    Spouse name: Not on file   Number of children: 2   Years of education: Not on file   Highest education level: Some college, no degree  Occupational History   Not on file  Tobacco Use   Smoking status: Never   Smokeless tobacco: Never  Vaping Use   Vaping status: Never Used  Substance and Sexual Activity   Alcohol use: Yes    Comment: occ   Drug use: No   Sexual activity: Not on file  Other Topics Concern   Not on file  Social History Narrative   Lives with his brother    Social Drivers of Health   Financial Resource Strain: Low Risk  (07/23/2024)   Overall Financial Resource Strain (CARDIA)    Difficulty of Paying Living Expenses: Not very hard  Food Insecurity: No Food Insecurity (07/23/2024)   Hunger Vital Sign    Worried About Running Out of Food in the Last Year: Never true    Ran Out of Food in the Last Year:  Never true  Transportation Needs: No Transportation Needs (07/23/2024)   PRAPARE - Administrator, Civil Service (Medical): No    Lack of Transportation (Non-Medical): No  Physical Activity: Inactive (07/23/2024)   Exercise Vital Sign    Days of Exercise per Week: 0 days    Minutes of Exercise per Session: Not on file  Stress: Stress Concern Present (07/23/2024)   [employer] of Occupational Health - Occupational Stress Questionnaire    Feeling of Stress: To some extent  Social Connections: Unknown (07/23/2024)   Social Connection and Isolation Panel    Frequency of  Communication with Friends and Family: Once a week    Frequency of Social Gatherings with Friends and Family: Patient declined    Attends Religious Services: Never    Database Administrator or Organizations: No    Attends Engineer, Structural: Not on file    Marital Status: Divorced  Catering Manager Violence: Not on file    Outpatient Medications Prior to Visit  Medication Sig Dispense Refill   dicyclomine  (BENTYL ) 20 MG tablet Take 1 tablet (20 mg total) by mouth 3 (three) times daily as needed (abd spasms). 20 tablet 0   ibuprofen (ADVIL) 600 MG tablet Take 1 tablet (600 mg total) by mouth every 6 (six) hours as needed. 30 tablet 0   polyethylene glycol powder (MIRALAX) 17 GM/SCOOP powder      olmesartan -hydrochlorothiazide (BENICAR  HCT) 40-25 MG tablet Take 1 tablet by mouth daily. 90 tablet 1   cetirizine (ZYRTEC ALLERGY) 10 MG tablet Take 1 tablet (10 mg total) by mouth daily. (Patient not taking: Reported on 07/23/2024) 30 tablet 0   promethazine-dextromethorphan (PROMETHAZINE-DM) 6.25-15 MG/5ML syrup Take 5 mLs by mouth at bedtime as needed for cough. (Patient not taking: Reported on 07/23/2024) 100 mL 0   pseudoephedrine (SUDAFED) 30 MG tablet Take 1 tablet (30 mg total) by mouth every 8 (eight) hours as needed for congestion. (Patient not taking: Reported on 07/23/2024) 30 tablet 0   No facility-administered medications prior to visit.    No Known Allergies  ROS Review of Systems  Constitutional:  Negative for appetite change, chills, fatigue and fever.  HENT:  Negative for congestion, postnasal drip, rhinorrhea and sneezing.   Respiratory:  Negative for cough, shortness of breath and wheezing.   Cardiovascular:  Negative for chest pain, palpitations and leg swelling.  Gastrointestinal:  Negative for abdominal pain, constipation, nausea and vomiting.  Genitourinary:  Negative for difficulty urinating, dysuria, flank pain and frequency.  Musculoskeletal:  Positive  for arthralgias. Negative for back pain, joint swelling and myalgias.  Skin:  Negative for color change, pallor and rash.  Neurological:  Negative for dizziness, facial asymmetry, weakness, numbness and headaches.  Psychiatric/Behavioral:  Negative for behavioral problems, confusion, self-injury and suicidal ideas.       Objective:    Physical Exam Vitals and nursing note reviewed.  Constitutional:      General: He is not in acute distress.    Appearance: Normal appearance. He is not ill-appearing, toxic-appearing or diaphoretic.  Eyes:     General: No scleral icterus.       Right eye: No discharge.        Left eye: No discharge.     Extraocular Movements: Extraocular movements intact.     Conjunctiva/sclera: Conjunctivae normal.  Cardiovascular:     Rate and Rhythm: Normal rate and regular rhythm.     Pulses: Normal pulses.     Heart sounds: Normal  heart sounds. No murmur heard.    No friction rub. No gallop.  Pulmonary:     Effort: Pulmonary effort is normal. No respiratory distress.     Breath sounds: Normal breath sounds. No stridor. No wheezing, rhonchi or rales.  Chest:     Chest wall: No tenderness.  Abdominal:     General: There is no distension.     Palpations: Abdomen is soft.     Tenderness: There is no abdominal tenderness. There is no right CVA tenderness, left CVA tenderness or guarding.  Musculoskeletal:        General: No swelling or signs of injury.     Right lower leg: No edema.     Left lower leg: No edema.  Skin:    General: Skin is warm and dry.     Capillary Refill: Capillary refill takes less than 2 seconds.     Coloration: Skin is not jaundiced or pale.     Findings: No bruising, erythema or lesion.  Neurological:     Mental Status: He is alert and oriented to person, place, and time.     Motor: No weakness.     Coordination: Coordination normal.     Gait: Gait normal.  Psychiatric:        Mood and Affect: Mood normal.        Behavior:  Behavior normal.        Thought Content: Thought content normal.        Judgment: Judgment normal.     BP 132/68   Pulse 69   Wt 221 lb (100.2 kg)   SpO2 99%   BMI 29.16 kg/m  Wt Readings from Last 3 Encounters:  07/23/24 221 lb (100.2 kg)  07/01/24 222 lb 6.4 oz (100.9 kg)  03/22/24 213 lb (96.6 kg)    No results found for: TSH Lab Results  Component Value Date   WBC 4.1 03/17/2024   HGB 12.4 (L) 03/17/2024   HCT 37.0 (L) 03/17/2024   MCV 79.4 (L) 03/17/2024   PLT 155 03/17/2024   Lab Results  Component Value Date   NA 138 03/17/2024   K 5.0 03/17/2024   CO2 27 03/17/2024   GLUCOSE 97 03/17/2024   BUN 16 03/17/2024   CREATININE 1.01 03/17/2024   BILITOT 0.4 03/17/2024   ALKPHOS 70 03/17/2024   AST 21 03/17/2024   ALT 15 03/17/2024   PROT 6.0 (L) 03/17/2024   ALBUMIN 4.0 03/17/2024   CALCIUM 8.9 03/17/2024   ANIONGAP 9 03/17/2024   EGFR 78 11/21/2023   No results found for: CHOL No results found for: HDL No results found for: LDLCALC No results found for: TRIG No results found for: CHOLHDL Lab Results  Component Value Date   HGBA1C 6.2 12/03/2008      Assessment & Plan:   Problem List Items Addressed This Visit       Cardiovascular and Mediastinum   Primary hypertension - Primary   BP Readings from Last 3 Encounters:  07/23/24 132/68  07/17/24 137/86  07/01/24 129/83   Essential hypertension Blood pressure controlled at 132/68 mmHg with current medication. - Continue olmesartan -hydrochlorothiazide 40-25 mg orally daily. Discussed DASH diet and dietary sodium restrictions Continue to increase dietary efforts and exercise.          Relevant Medications   olmesartan -hydrochlorothiazide (BENICAR  HCT) 40-25 MG tablet   Other Relevant Orders   CBC   Basic Metabolic Panel     Digestive   Alcoholic liver disease  Alcohol-related liver disease and alcohol use disorder Alcohol limited to special occasions. Previous ultrasound  showed no significant fatty tissue. Advised to avoid alcohol due to liver disease risk. - Advised to avoid alcohol consumption. - Encouraged follow-up with gastroenterologist if abdominal pain persists.       Diverticulosis of colon   Diverticulosis of colon Diverticulosis confirmed by colonoscopy. No symptoms of bleeding or severe pain, mild abdominal pain reported. - Advised high-fiber diet and regular exercise. - Instructed to monitor for signs of bleeding or severe pain.          Genitourinary   BPH (benign prostatic hyperplasia)   Benign prostatic hyperplasia Mildly enlarged prostate on CT scan. No urinary symptoms reported. - Ordered PSA test.          Other   Alcohol use disorder   Advised to limit alcohol intake to less than 2 a day, and even better avoid alcohol altogether      Lower extremity edema   No edema noted today  Edema managed with compression socks. No significant swelling reported. - Continue use of compression socks.       Chronic pain of both feet   Reported Bunion of foot and flat foot (pes planus) Bunion pain managed with ibuprofen and ice. Advised to use comfortable, well-padded shoes. - Advised use of Tylenol 650 mg every 6 hours as needed for pain. - Recommended wearing comfortable, well-padded shoes.       Other Visit Diagnoses       Need for influenza vaccination       Relevant Orders   Flu vaccine HIGH DOSE PF(Fluzone Trivalent) (Completed)     Screening for prostate cancer       Relevant Orders   PSA       Meds ordered this encounter  Medications   olmesartan -hydrochlorothiazide (BENICAR  HCT) 40-25 MG tablet    Sig: Take 1 tablet by mouth daily.    Dispense:  90 tablet    Refill:  1    Follow-up: Return in about 4 months (around 11/20/2024) for CPE.    Jayline Kilburg R Ortha Metts, FNP

## 2024-07-24 LAB — BASIC METABOLIC PANEL WITH GFR
BUN/Creatinine Ratio: 18 (ref 10–24)
BUN: 19 mg/dL (ref 8–27)
CO2: 26 mmol/L (ref 20–29)
Calcium: 9.6 mg/dL (ref 8.6–10.2)
Chloride: 103 mmol/L (ref 96–106)
Creatinine, Ser: 1.06 mg/dL (ref 0.76–1.27)
Glucose: 77 mg/dL (ref 70–99)
Potassium: 5.4 mmol/L — ABNORMAL HIGH (ref 3.5–5.2)
Sodium: 139 mmol/L (ref 134–144)
eGFR: 77 mL/min/1.73 (ref >59)

## 2024-07-24 LAB — PSA: Prostate Specific Ag, Serum: 1.3 ng/mL (ref 0.0–4.0)

## 2024-07-24 LAB — CBC
Hematocrit: 40.8 % (ref 37.5–51.0)
Hemoglobin: 13.1 g/dL (ref 13.0–17.7)
MCH: 27.1 pg (ref 26.6–33.0)
MCHC: 32.1 g/dL (ref 31.5–35.7)
MCV: 84 fL (ref 79–97)
Platelets: 217 x10E3/uL (ref 150–450)
RBC: 4.84 x10E6/uL (ref 4.14–5.80)
RDW: 14.1 % (ref 11.6–15.4)
WBC: 4.3 x10E3/uL (ref 3.4–10.8)

## 2024-07-26 ENCOUNTER — Ambulatory Visit: Payer: Self-pay | Admitting: Nurse Practitioner

## 2024-07-26 DIAGNOSIS — E875 Hyperkalemia: Secondary | ICD-10-CM

## 2024-07-30 ENCOUNTER — Ambulatory Visit: Admitting: Podiatry

## 2024-07-30 DIAGNOSIS — M7672 Peroneal tendinitis, left leg: Secondary | ICD-10-CM

## 2024-07-30 DIAGNOSIS — Q666 Other congenital valgus deformities of feet: Secondary | ICD-10-CM | POA: Diagnosis not present

## 2024-07-30 NOTE — Progress Notes (Unsigned)
  Subjective:  Patient ID: Donald Powers, male    DOB: [birth year],  MRN: [id number]  Chief Complaint  Patient presents with   Foot Pain    Pt is here for a follow up on left foot pain he stated that he bought shoes and has tried the orthotics but nothing seems to help     68 y.o. male presents with the above complaint.  Patient presents with left lateral adenitis/foot pain.  She states that he still continues to have pain.  He is debating orthotics which has helped some but not much.  He has bought shoes as well.  Wanted to discuss next treatment plan has not seen and was prior to seeing me for this.  Denies any other acute complaints.   Review of Systems: Negative except as noted in the HPI. Denies N/V/F/Ch.  Past Medical History:  Diagnosis Date   Alcoholic liver disease    Hypertension     Current Outpatient Medications:    cetirizine  (ZYRTEC  ALLERGY) 10 MG tablet, Take 1 tablet (10 mg total) by mouth daily. (Patient not taking: Reported on 07/23/2024), Disp: 30 tablet, Rfl: 0   dicyclomine  (BENTYL ) 20 MG tablet, Take 1 tablet (20 mg total) by mouth 3 (three) times daily as needed (abd spasms)., Disp: 20 tablet, Rfl: 0   ibuprofen  (ADVIL ) 600 MG tablet, Take 1 tablet (600 mg total) by mouth every 6 (six) hours as needed., Disp: 30 tablet, Rfl: 0   olmesartan -hydrochlorothiazide (BENICAR  HCT) 40-25 MG tablet, Take 1 tablet by mouth daily., Disp: 90 tablet, Rfl: 1   polyethylene glycol powder (MIRALAX) 17 GM/SCOOP powder, , Disp: , Rfl:   Social History   Tobacco Use  Smoking Status Never  Smokeless Tobacco Never    No Known Allergies Objective:  There were no vitals filed for this visit. There is no height or weight on file to calculate BMI. Constitutional Well developed. Well nourished.  Vascular Dorsalis pedis pulses palpable bilaterally. Posterior tibial pulses palpable bilaterally. Capillary refill normal to all digits.  No cyanosis or clubbing noted. Pedal hair  growth normal.  Neurologic Normal speech. Oriented to person, place, and time. Epicritic sensation to light touch grossly present bilaterally.  Dermatologic Nails well groomed and normal in appearance. No open wounds. No skin lesions.  Orthopedic: Pain along the course of the peroneal tendon pain with resisted dorsiflexion eversion of the foot no pain with plantarflexion inversion of the foot.  No pain at the posterior tibial tendon Achilles tendon ATFL ligament.   Radiographs: None Assessment:   1. Peroneal tendinitis, left    Plan:  Patient was evaluated and treated and all questions answered.  Left peroneal tendinitis/bony exostosis with underlying pes planovalgus - All questions and concerns were discussed with the patient in extensive detail given the amount of pain that he is having he will benefit from steroid injection of decreasing inflammatory, or surgical pain.  Patient agrees with plan to proceed with steroid injection -A steroid injection was performed at left peroneal tendon using 1% plain Lidocaine  and 10 mg of Kenalog . This was well tolerated. - Patient's orthotics are functioning well and no acute complaints   No follow-ups on file.

## 2024-08-04 ENCOUNTER — Encounter: Payer: Self-pay | Admitting: Podiatry

## 2024-08-20 ENCOUNTER — Ambulatory Visit: Admitting: Podiatry

## 2024-09-08 ENCOUNTER — Ambulatory Visit: Admitting: Podiatry

## 2024-09-15 ENCOUNTER — Ambulatory Visit (INDEPENDENT_AMBULATORY_CARE_PROVIDER_SITE_OTHER): Admitting: Podiatry

## 2024-09-15 DIAGNOSIS — R252 Cramp and spasm: Secondary | ICD-10-CM | POA: Diagnosis not present

## 2024-09-15 DIAGNOSIS — M7672 Peroneal tendinitis, left leg: Secondary | ICD-10-CM | POA: Diagnosis not present

## 2024-09-15 MED ORDER — CYCLOBENZAPRINE HCL 10 MG PO TABS: 10.0000 mg | ORAL_TABLET | Freq: Three times a day (TID) | ORAL | 0 refills | Status: AC | PRN

## 2024-09-15 NOTE — Progress Notes (Signed)
 "  Subjective:  Patient ID: Donald Powers, male    DOB: 03/26/1956,  MRN: [id number]  Chief Complaint  Patient presents with   Foot Pain    Pt stated that he has been having some leg cramps along with the discomfort in his foot     69 y.o. male presents with the above complaint.  Patient presents with left lateral adenitis/foot pain.  She states that he still continues to have pain.  He is debating orthotics which has helped some but not much.  He has bought shoes as well.  Wanted to discuss next treatment plan has not seen and was prior to seeing me for this.  Denies any other acute complaints.  Patient also presents with complaint of muscle cramping as well.  Wanted to get it evaluated denies any other acute complaints   Review of Systems: Negative except as noted in the HPI. Denies N/V/F/Ch.  Past Medical History:  Diagnosis Date   Alcoholic liver disease    Hypertension     Current Outpatient Medications:    cyclobenzaprine  (FLEXERIL ) 10 MG tablet, Take 1 tablet (10 mg total) by mouth 3 (three) times daily as needed for muscle spasms., Disp: 30 tablet, Rfl: 0   cetirizine  (ZYRTEC  ALLERGY) 10 MG tablet, Take 1 tablet (10 mg total) by mouth daily. (Patient not taking: Reported on 07/23/2024), Disp: 30 tablet, Rfl: 0   dicyclomine  (BENTYL ) 20 MG tablet, Take 1 tablet (20 mg total) by mouth 3 (three) times daily as needed (abd spasms)., Disp: 20 tablet, Rfl: 0   ibuprofen  (ADVIL ) 600 MG tablet, Take 1 tablet (600 mg total) by mouth every 6 (six) hours as needed., Disp: 30 tablet, Rfl: 0   olmesartan -hydrochlorothiazide (BENICAR  HCT) 40-25 MG tablet, Take 1 tablet by mouth daily., Disp: 90 tablet, Rfl: 1   polyethylene glycol powder (MIRALAX) 17 GM/SCOOP powder, , Disp: , Rfl:   Social History   Tobacco Use  Smoking Status Never  Smokeless Tobacco Never    No Known Allergies Objective:  There were no vitals filed for this visit. There is no height or weight on file to calculate  BMI. Constitutional Well developed. Well nourished.  Vascular Dorsalis pedis pulses palpable bilaterally. Posterior tibial pulses palpable bilaterally. Capillary refill normal to all digits.  No cyanosis or clubbing noted. Pedal hair growth normal.  Neurologic Normal speech. Oriented to person, place, and time. Epicritic sensation to light touch grossly present bilaterally.  Dermatologic Nails well groomed and normal in appearance. No open wounds. No skin lesions.  Orthopedic: Pain along the course of the peroneal tendon pain with resisted dorsiflexion eversion of the foot no pain with plantarflexion inversion of the foot.  No pain at the posterior tibial tendon Achilles tendon ATFL ligament.   Radiographs: None Assessment:   1. Muscle cramp   2. Peroneal tendinitis, left     Plan:  Patient was evaluated and treated and all questions answered.  Left peroneal tendinitis/bony exostosis with underlying pes planovalgus - All questions and concerns were discussed with the patient in extensive detail given the amount of pain that he is having he will benefit from steroid injection of decreasing inflammatory, or surgical pain.  Patient agrees with plan to proceed with steroid injection -A steroid injection was performed at left peroneal tendon using 1% plain Lidocaine  and 10 mg of Kenalog . This was well tolerated. - Patient's orthotics are functioning well and no acute complaints  Muscle cramp Explained to the patient the etiology of muscle cramp  and treatment options were discussed given that this subjectively sporadically happens at nighttime will benefit from muscle relaxer muscle relaxer will send to the pharmacy-   No follow-ups on file.  "

## 2024-11-29 ENCOUNTER — Encounter: Payer: Self-pay | Admitting: Nurse Practitioner
